# Patient Record
Sex: Male | Born: 1935 | Race: White | Hispanic: No | Smoking: Former smoker
Health system: Southern US, Community
[De-identification: ages and names within clinical notes are randomized; demographics above are authoritative.]

## PROBLEM LIST (undated history)

## (undated) DIAGNOSIS — E785 Hyperlipidemia, unspecified: Secondary | ICD-10-CM

## (undated) DIAGNOSIS — F29 Unspecified psychosis not due to a substance or known physiological condition: Secondary | ICD-10-CM

## (undated) DIAGNOSIS — I639 Cerebral infarction, unspecified: Secondary | ICD-10-CM

## (undated) DIAGNOSIS — C50919 Malignant neoplasm of unspecified site of unspecified female breast: Secondary | ICD-10-CM

## (undated) DIAGNOSIS — G40909 Epilepsy, unspecified, not intractable, without status epilepticus: Secondary | ICD-10-CM

## (undated) DIAGNOSIS — E039 Hypothyroidism, unspecified: Secondary | ICD-10-CM

## (undated) DIAGNOSIS — T7840XA Allergy, unspecified, initial encounter: Secondary | ICD-10-CM

## (undated) DIAGNOSIS — F32A Depression, unspecified: Secondary | ICD-10-CM

## (undated) DIAGNOSIS — I10 Essential (primary) hypertension: Secondary | ICD-10-CM

## (undated) DIAGNOSIS — I509 Heart failure, unspecified: Secondary | ICD-10-CM

## (undated) DIAGNOSIS — F329 Major depressive disorder, single episode, unspecified: Secondary | ICD-10-CM

## (undated) HISTORY — DX: Unspecified psychosis not due to a substance or known physiological condition: F29

## (undated) HISTORY — DX: Depression, unspecified: F32.A

## (undated) HISTORY — PX: APPENDECTOMY: SHX54

## (undated) HISTORY — DX: Hyperlipidemia, unspecified: E78.5

## (undated) HISTORY — DX: Essential (primary) hypertension: I10

## (undated) HISTORY — PX: EYE SURGERY: SHX253

## (undated) HISTORY — DX: Cerebral infarction, unspecified: I63.9

## (undated) HISTORY — DX: Heart failure, unspecified: I50.9

## (undated) HISTORY — DX: Allergy, unspecified, initial encounter: T78.40XA

## (undated) HISTORY — DX: Major depressive disorder, single episode, unspecified: F32.9

## (undated) HISTORY — DX: Malignant neoplasm of unspecified site of unspecified female breast: C50.919

## (undated) HISTORY — DX: Hypothyroidism, unspecified: E03.9

## (undated) HISTORY — DX: Epilepsy, unspecified, not intractable, without status epilepticus: G40.909

---

## 2002-02-06 ENCOUNTER — Encounter: Payer: Self-pay | Admitting: Ophthalmology

## 2002-02-10 ENCOUNTER — Ambulatory Visit (HOSPITAL_COMMUNITY): Admission: RE | Admit: 2002-02-10 | Discharge: 2002-02-10 | Payer: Self-pay | Admitting: Ophthalmology

## 2003-07-19 ENCOUNTER — Ambulatory Visit (HOSPITAL_COMMUNITY): Admission: RE | Admit: 2003-07-19 | Discharge: 2003-07-19 | Payer: Self-pay | Admitting: Family Medicine

## 2006-01-11 ENCOUNTER — Encounter: Payer: Self-pay | Admitting: Internal Medicine

## 2006-04-14 ENCOUNTER — Ambulatory Visit (HOSPITAL_COMMUNITY): Admission: RE | Admit: 2006-04-14 | Discharge: 2006-04-14 | Payer: Self-pay | Admitting: Family Medicine

## 2006-07-18 ENCOUNTER — Encounter: Admission: RE | Admit: 2006-07-18 | Discharge: 2006-07-18 | Payer: Self-pay | Admitting: Neurology

## 2006-09-23 ENCOUNTER — Ambulatory Visit (HOSPITAL_COMMUNITY): Admission: RE | Admit: 2006-09-23 | Discharge: 2006-09-23 | Payer: Self-pay | Admitting: Neurology

## 2009-05-13 ENCOUNTER — Inpatient Hospital Stay (HOSPITAL_COMMUNITY): Admission: EM | Admit: 2009-05-13 | Discharge: 2009-05-16 | Payer: Self-pay | Admitting: Emergency Medicine

## 2009-05-14 ENCOUNTER — Encounter (INDEPENDENT_AMBULATORY_CARE_PROVIDER_SITE_OTHER): Payer: Self-pay | Admitting: Internal Medicine

## 2009-06-06 ENCOUNTER — Emergency Department (HOSPITAL_COMMUNITY): Admission: EM | Admit: 2009-06-06 | Discharge: 2009-06-07 | Payer: Self-pay | Admitting: Emergency Medicine

## 2009-06-21 ENCOUNTER — Inpatient Hospital Stay (HOSPITAL_COMMUNITY): Admission: EM | Admit: 2009-06-21 | Discharge: 2009-06-23 | Payer: Self-pay | Admitting: Emergency Medicine

## 2009-09-30 ENCOUNTER — Encounter (INDEPENDENT_AMBULATORY_CARE_PROVIDER_SITE_OTHER): Payer: Self-pay | Admitting: Internal Medicine

## 2009-09-30 ENCOUNTER — Inpatient Hospital Stay (HOSPITAL_COMMUNITY): Admission: RE | Admit: 2009-09-30 | Discharge: 2009-10-02 | Payer: Self-pay | Admitting: Emergency Medicine

## 2009-09-30 ENCOUNTER — Ambulatory Visit: Payer: Self-pay | Admitting: Surgery

## 2009-10-01 ENCOUNTER — Encounter (INDEPENDENT_AMBULATORY_CARE_PROVIDER_SITE_OTHER): Payer: Self-pay | Admitting: Internal Medicine

## 2010-03-07 ENCOUNTER — Encounter: Payer: Self-pay | Admitting: Internal Medicine

## 2010-03-10 ENCOUNTER — Inpatient Hospital Stay (HOSPITAL_COMMUNITY): Admission: EM | Admit: 2010-03-10 | Discharge: 2010-03-13 | Payer: Self-pay | Admitting: Emergency Medicine

## 2010-03-11 ENCOUNTER — Encounter (INDEPENDENT_AMBULATORY_CARE_PROVIDER_SITE_OTHER): Payer: Self-pay | Admitting: Internal Medicine

## 2010-03-11 ENCOUNTER — Ambulatory Visit: Payer: Self-pay | Admitting: Vascular Surgery

## 2010-06-09 ENCOUNTER — Telehealth: Payer: Self-pay | Admitting: Internal Medicine

## 2010-06-12 ENCOUNTER — Encounter: Payer: Self-pay | Admitting: Internal Medicine

## 2010-06-13 ENCOUNTER — Ambulatory Visit
Admission: RE | Admit: 2010-06-13 | Discharge: 2010-06-13 | Payer: Self-pay | Source: Home / Self Care | Attending: Internal Medicine | Admitting: Internal Medicine

## 2010-06-13 ENCOUNTER — Encounter: Payer: Self-pay | Admitting: Internal Medicine

## 2010-06-13 DIAGNOSIS — I10 Essential (primary) hypertension: Secondary | ICD-10-CM | POA: Insufficient documentation

## 2010-06-13 DIAGNOSIS — R609 Edema, unspecified: Secondary | ICD-10-CM | POA: Insufficient documentation

## 2010-06-13 DIAGNOSIS — F0150 Vascular dementia without behavioral disturbance: Secondary | ICD-10-CM | POA: Insufficient documentation

## 2010-06-13 DIAGNOSIS — E785 Hyperlipidemia, unspecified: Secondary | ICD-10-CM | POA: Insufficient documentation

## 2010-06-13 DIAGNOSIS — I6990 Unspecified sequelae of unspecified cerebrovascular disease: Secondary | ICD-10-CM | POA: Insufficient documentation

## 2010-06-13 DIAGNOSIS — F329 Major depressive disorder, single episode, unspecified: Secondary | ICD-10-CM | POA: Insufficient documentation

## 2010-06-13 DIAGNOSIS — I5022 Chronic systolic (congestive) heart failure: Secondary | ICD-10-CM | POA: Insufficient documentation

## 2010-06-13 DIAGNOSIS — J309 Allergic rhinitis, unspecified: Secondary | ICD-10-CM | POA: Insufficient documentation

## 2010-06-13 DIAGNOSIS — R443 Hallucinations, unspecified: Secondary | ICD-10-CM | POA: Insufficient documentation

## 2010-06-13 DIAGNOSIS — K5909 Other constipation: Secondary | ICD-10-CM | POA: Insufficient documentation

## 2010-06-13 DIAGNOSIS — R569 Unspecified convulsions: Secondary | ICD-10-CM | POA: Insufficient documentation

## 2010-06-13 DIAGNOSIS — E039 Hypothyroidism, unspecified: Secondary | ICD-10-CM | POA: Insufficient documentation

## 2010-06-16 ENCOUNTER — Encounter: Payer: Self-pay | Admitting: Internal Medicine

## 2010-06-17 LAB — CONVERTED CEMR LAB
ALT: 9 units/L (ref 0–53)
AST: 15 units/L (ref 0–37)
Basophils Absolute: 0 10*3/uL (ref 0.0–0.1)
CO2: 22 meq/L (ref 19–32)
Creatinine, Ser: 0.88 mg/dL (ref 0.40–1.50)
Eosinophils Absolute: 0.3 10*3/uL (ref 0.0–0.7)
Free T4: 1.21 ng/dL (ref 0.80–1.80)
Glucose, Bld: 108 mg/dL — ABNORMAL HIGH (ref 70–99)
HCT: 44.6 % (ref 39.0–52.0)
Hemoglobin: 14.9 g/dL (ref 13.0–17.0)
LDL Cholesterol: 141 mg/dL — ABNORMAL HIGH (ref 0–99)
Monocytes Relative: 8 % (ref 3–12)
Neutrophils Relative %: 69 % (ref 43–77)
Platelets: 306 10*3/uL (ref 150–400)
RBC: 4.43 M/uL (ref 4.22–5.81)
RDW: 13.4 % (ref 11.5–15.5)
Sodium: 143 meq/L (ref 135–145)
Total Bilirubin: 0.4 mg/dL (ref 0.3–1.2)
Total CHOL/HDL Ratio: 5.5
Total Protein: 7.2 g/dL (ref 6.0–8.3)
Triglycerides: 134 mg/dL (ref <150)
VLDL: 27 mg/dL (ref 0–40)
WBC: 9.2 10*3/uL (ref 4.0–10.5)

## 2010-06-23 ENCOUNTER — Encounter: Payer: Self-pay | Admitting: Internal Medicine

## 2010-06-24 ENCOUNTER — Telehealth: Payer: Self-pay | Admitting: Internal Medicine

## 2010-06-26 NOTE — Miscellaneous (Signed)
Summary: Do Not Resuscitate Order  Do Not Resuscitate Order   Imported By: Beau Fanny 06/17/2010 10:47:36  _____________________________________________________________________  External Attachment:    Type:   Image     Comment:   External Document

## 2010-06-26 NOTE — Assessment & Plan Note (Signed)
Summary: NEW PATIENT/DS   Vital Signs:  Patient profile:   77 year old male Height:      71 inches Weight:      168 pounds BMI:     23.52 O2 Sat:      91 % on Room air Temp:     97.7 degrees F oral Pulse rate:   69 / minute Pulse rhythm:   regular BP sitting:   117 / 63  (left arm) Cuff size:   large  Vitals Entered By: Mervin Hack CMA Duncan Dull) (June 13, 2010 3:35 PM)  O2 Flow:  Room air CC: new patient to establish care   History of Present Illness: Here with daughter, [name] and friend, [name] (caregiver)  Establishing here had been under the care of [facility]--Dr Jeannetta Nap Not convenient location  Had just had stay in [facility] for rehab after apparent minor stroke Was in [facility] the SNF There May until last week did have pneumonia hospitalization in October Lives in his home--now with modifications for handicapped Daughter lives there Had 24 hour care  Hasn't walked since April Now can walk with walker briefly--wheelchair in house at times and for going out Needs assist with bathing and dressing Occ urinary and stool  incontinence. Usually continent though Some swalling problems in past---briefly on honey and nectar thick liquids but back on thin liquids Now on normal consistency diet (avoiding steak, etc)  Has had some behavior issues of late Didn't sleep well last night---more sedate now Other days, "a holy terror".  Tries to go outside, gently physical aggression (just pushes people away), not cooperative ???depression Has been treated since wife died 8 years ago (10mg  then) Some delusions/hallucinations--reaching for objects in the air, talks with wife Occ anxiety--fear of falling or being alone  Hypothyroidism diagnosed in past few months  Long standing HTN and high chol for some time Off BP meds for some time and restarted in hospital recently  Seizure disorder since 5-7 years none lately  off valproate now of late   Preventive  Screening-Counseling & Management  Alcohol-Tobacco     Smoking Status: quit  Allergies (verified): 1)  ! Diamox Sequels (Acetazolamide) 2)  ! Ambien (Zolpidem Tartrate) 3)  ! Cephalexin (Cephalexin)  Past History:  Past Medical History: Allergic rhinitis Depression Hyperlipidemia Hypertension Hypothyroidism Vascular dementia CVA--late CHF--systolic  (apparent silent MI per echo) Seizure disorder Breast cancer--right   (mastectomy and no other Rx) Psychosis Constipation --chronic  Past Surgical History: Cataract extraction-- then further work due to complications Appendectomy Tonsillectomy Mastectomy on right--  ~1986  Family History: Strokes are strong in family--all brothers died of this Parents died of stroke and MI SOme Altzheimers in sisters  Social History: Widowed  ~2003 1 daughter Retired---sheet Advice worker Former Smoker Alcohol use-no  Has living will. Daughter has health care power of attorney. Has DNR order. No tube feedsSmoking Status:  quit  Review of Systems General:  eating pretty well weight may be down a touch sleeps okay usually. Eyes:  Denies double vision; can't see out of right. ENT:  reasonable hearing own teeth. CV:  Denies chest pain or discomfort, fatigue, palpitations, and shortness of breath with exertion. Resp:  Complains of cough; denies shortness of breath; occ cough--if he lies flat. GI:  Complains of constipation and hemorrhoids; occ red blood on toilet paper or dripping in bowl. GU:  Complains of incontinence, nocturia, and urinary hesitancy; uses urinal at night. MS:  Complains of joint pain and joint  swelling; ongoing knee, hip, pelvis, feet pain did have crane injury years ago to pelvis and foot. Derm:  current skin cancer---Dr Margo Aye will remove SCC on right cheek Multiple skin cancer. Neuro:  Complains of headaches and weakness; occ headaches. Psych:  Complains of alternate hallucination ( auditory/visual), anxiety,  and depression. Allergy:  Complains of seasonal allergies and sneezing; No meds currently.  Physical Exam  General:  alert and pleasant Limited interaction Eyes:  irregular right pupil left is normal Mouth:  no erythema, no exudates, and no lesions.   Neck:  supple, no masses, no thyromegaly, and no cervical lymphadenopathy.   Lungs:  normal respiratory effort, no intercostal retractions, no accessory muscle use, and normal breath sounds.   Heart:  normal rate, regular rhythm, no murmur, and no gallop.   Abdomen:  soft and non-tender.   Msk:  thickening in joints but active synovitis Extremities:  no edema feet cool with mild purplish discoloration mycotic toenails Neurologic:  Oriented only to person and knows daughter/friend Appropriate terse answers Mild spasticity and slight decreased strength on right side Face has slight droop to right Skin:  no ulcerations.   Large SCC on right face Axillary Nodes:  No palpable lymphadenopathy Psych:  subdued.   Limited interaction --fairly passive   Impression & Recommendations:  Problem # 1:  VASCULAR DEMENTIA (ICD-290.40) Assessment Comment Only mild to moderate Aricept did help so will continue  Problem # 2:  UNSPECIFIED PSYCHOSIS (ICD-298.9) Assessment: Deteriorated more noticeable not severe now Consider cutting back aricept May need antipsychotic if worsens  Problem # 3:  CHRONIC SYSTOLIC HEART FAILURE (ICD-428.22) Assessment: Unchanged seems to be compensated No changes for now check labs  His updated medication list for this problem includes:    Furosemide 20 Mg Tabs (Furosemide) .[name]... Take 1 by mouth once daily    Metoprolol Tartrate 25 Mg Tabs (Metoprolol tartrate) .[name]... Take 1 by mouth two times a day    Aggrenox 25-200 Mg Xr12h-cap (Aspirin-dipyridamole) .[name]... Take 1 by mouth two times a day  Problem # 4:  DEPRESSION (ICD-311) Assessment: Deteriorated has gotten more noticeable may be related to the  agitation/psychosis will increase back to 10mg  daily  His updated medication list for this problem includes:    Lexapro 10 Mg Tabs (Escitalopram oxalate) .[name]... 1 tab by mouth daily for depression  Problem # 5:  CONSTIPATION, CHRONIC (ICD-564.09) Assessment: Deteriorated will have them start the miralax daily  His updated medication list for this problem includes:    Miralax Powd (Polyethylene glycol 3350) .[name]... 1 capful with water daily to prevent constipation  Problem # 6:  HYPOTHYROIDISM (ICD-244.9) Assessment: Unchanged  will check labs  His updated medication list for this problem includes:    Levothyroxine Sodium 100 Mcg Tabs (Levothyroxine sodium) .[name]... Take 1 by mouth once daily  Orders: T-TSH ([id number]) T-T4, Free [phone]) Venipuncture ([address]) Specimen Handling (66440)  Problem # 7:  CEREBROVASCULAR ACCIDENT, LATE EFFECTS (ICD-438.9) Assessment: Unchanged uses walker/wheelchair has 24 hour care---needs some help with all ADLs  His updated medication list for this problem includes:    Aggrenox 25-200 Mg Xr12h-cap (Aspirin-dipyridamole) .[name]... Take 1 by mouth two times a day  Problem # 8:  SEIZURE DISORDER (ICD-780.39) Assessment: Unchanged off meds no recent seizures  Complete Medication List: 1)  Levothyroxine Sodium 100 Mcg Tabs (Levothyroxine sodium) .... Take 1 by mouth once daily 2)  Furosemide 20 Mg Tabs (Furosemide) .... Take 1 by mouth once daily 3)  Metoprolol Tartrate 25 Mg Tabs (  Metoprolol tartrate) .... Take 1 by mouth two times a day 4)  Aggrenox 25-200 Mg Xr12h-cap (Aspirin-dipyridamole) .... Take 1 by mouth two times a day 5)  Potassium Chloride 20 Meq Pack (Potassium chloride) .... Take 1 by mouth once daily in the evening 6)  Simvastatin 20 Mg Tabs (Simvastatin) .... Take 1 by mouth once daily in the evening 7)  Aricept 10 Mg Tabs (Donepezil hcl) .... Take 1 by mouth once daily in the evening 8)  Betoptic-s 0.25 % Susp (Betaxolol hcl)  .... Two times a day 9)  Muro 128 2 % Soln (Sodium chloride (hypertonic)) .... Three times a day 10)  Mupirocin 2 % Oint (Mupirocin) .... Three times a day 11)  Miralax Powd (Polyethylene glycol 3350) .[name].. 1 capful with water daily to prevent constipation 12)  Lexapro 10 Mg Tabs (Escitalopram oxalate) .[name].. 1 tab by mouth daily for depression  Other Orders: T-Lipid Profile [phone]) T-Hepatic Function [id number]) T-Renal Function Panel [id number]) T-CBC w/Diff [phone])  Patient Instructions: 1)  Please schedule a follow-up appointment in 1 month.  Prescriptions: ARICEPT 10 MG TABS (DONEPEZIL HCL) take 1 by mouth once daily in the evening  #30 x 12   Entered and Authorized by:   Cindee Salt MD   Signed by:   Cindee Salt MD on 06/13/2010   Method used:   Electronically to        [facility]* (retail)       [address]       [place], [place]  [address]       Ph: [phone]       Fax: [phone]   RxID:   [id number] SIMVASTATIN 20 MG TABS (SIMVASTATIN) take 1 by mouth once daily in the evening  #30 x 12   Entered and Authorized by:   Cindee Salt MD   Signed by:   Cindee Salt MD on 06/13/2010   Method used:   Electronically to        [facility]* (retail)       [address]       [place], [place]  [address]       Ph: [phone]       Fax: [phone]   RxID:   [id number] POTASSIUM CHLORIDE 20 MEQ PACK (POTASSIUM CHLORIDE) take 1 by mouth once daily in the evening  #30 x 12   Entered and Authorized by:   Cindee Salt MD   Signed by:   Cindee Salt MD on 06/13/2010   Method used:   Electronically to        [facility]* (retail)       [address]       [place], [place]  [address]       Ph: [phone]       Fax: [phone]   RxID:   [id number] AGGRENOX 25-200 MG XR12H-CAP (ASPIRIN-DIPYRIDAMOLE) take 1 by mouth two times a day  #60 x 12   Entered and Authorized by:   Cindee Salt MD    Signed by:   Cindee Salt MD on 06/13/2010   Method used:   Electronically to        [facility]* (retail)       [address]       [place], [place]  [address]       Ph: [phone]       Fax: [phone]   RxID:   [id number] METOPROLOL TARTRATE 25 MG TABS (METOPROLOL TARTRATE) take  1 by mouth two times a day  #60 x 12   Entered and Authorized by:   Cindee Salt MD   Signed by:   Cindee Salt MD on 06/13/2010   Method used:   Electronically to        [facility]* (retail)       [address]       [place], [place]  [address]       Ph: [phone]       Fax: [phone]   RxID:   [id number] FUROSEMIDE 20 MG TABS (FUROSEMIDE) take 1 by mouth once daily  #30 x 12   Entered and Authorized by:   Cindee Salt MD   Signed by:   Cindee Salt MD on 06/13/2010   Method used:   Electronically to        [facility]* (retail)       [address]       [place], [place]  [address]       Ph: [phone]       Fax: [phone]   RxID:   [id number] LEVOTHYROXINE SODIUM 100 MCG TABS (LEVOTHYROXINE SODIUM) take 1 by mouth once daily  #30 x 12   Entered and Authorized by:   Cindee Salt MD   Signed by:   Cindee Salt MD on 06/13/2010   Method used:   Electronically to        [facility]* (retail)       [address]       [place], [place]  [address]       Ph: [phone]       Fax: [phone]   RxID:   [id number] LEXAPRO 10 MG TABS (ESCITALOPRAM OXALATE) 1 tab by mouth daily for depression  #30 x 11   Entered and Authorized by:   Cindee Salt MD   Signed by:   Cindee Salt MD on 06/13/2010   Method used:   Electronically to        [facility]* (retail)       [address]       [place], [place]  [address]       Ph: [phone]       Fax: [phone]   RxID:   [id number]    Orders Added: 1)  New Patient Level V [99205] 2)  T-Lipid Profile [[id number]] 3)  T-Hepatic Function  [51025-85277] 4)  T-Renal Function Panel [[id number]] 5)  T-CBC w/Diff [[id number]] 6)  T-TSH [[id number]] 7)  T-T4, Free [61950-93267] 8)  Venipuncture [12458] 9)  Specimen Handling [99000]    Current Allergies (reviewed today): ! DIAMOX SEQUELS (ACETAZOLAMIDE) ! AMBIEN (ZOLPIDEM TARTRATE) ! CEPHALEXIN (CEPHALEXIN)

## 2010-06-26 NOTE — Miscellaneous (Signed)
Summary: Certification, Treatment Plan, Standing Orders/[facility]  Certification, Treatment Plan, Standing Orders/[facility]   Imported By: Maryln Gottron 06/18/2010 13:03:10  _____________________________________________________________________  External Attachment:    Type:   Image     Comment:   External Document

## 2010-06-26 NOTE — Progress Notes (Signed)
Summary: List of Medications brought in by patient  List of Medications brought in by patient   Imported By: Maryln Gottron 06/18/2010 13:01:03  _____________________________________________________________________  External Attachment:    Type:   Image     Comment:   External Document

## 2010-06-26 NOTE — Progress Notes (Signed)
Summary: pt is agitated  Phone Note Call from Patient Call back at Home Phone [phone]   Caller: Daughter  Micheal Townsend [phone] Summary of Call: Pt has a new pt appt on 1/27.  His daughter states pt is becoming increasingly more agitated. He is not currently on any meds for agitation.  He had ativan in the past but didnt tolerate that very well, it made him very hyper.  He does take a low dose of lexapro daily.  [facility]         Initial call taken by: Lowella Petties CMA, AAMA,  June 09, 2010 4:14 PM  Follow-up for Phone Call        Not sure what to use and not comfortable giving anything before his appt  If she is very worried about this, can move up appt to this Friday--add 45 minutes onto the end of my schedule need to make sure we get some records by then though Cindee Salt MD  June 09, 2010 5:03 PM   Spoke with daughter, changed appt to Friday 06/13/2010, records in your inbox DeShannon Katrinka Blazing CMA Duncan Dull)  June 09, 2010 5:16 PM

## 2010-06-26 NOTE — Miscellaneous (Signed)
Summary: [facility] Power of [facility] Power of Attorney   Imported By: Beau Fanny 06/17/2010 10:48:54  _____________________________________________________________________  External Attachment:    Type:   Image     Comment:   External Document

## 2010-07-01 ENCOUNTER — Encounter: Payer: Self-pay | Admitting: Internal Medicine

## 2010-07-02 NOTE — Letter (Signed)
Summary: Date Range: 11-26-09 to 03-07-10/[facility]  Date Range: 11-26-09 to 03-07-10/[facility]   Imported By: Sherian Rein 06/25/2010 07:57:28  _____________________________________________________________________  External Attachment:    Type:   Image     Comment:   External Document

## 2010-07-02 NOTE — Miscellaneous (Signed)
Summary: Orders/[facility] @ [facility]  Orders/[facility] @ [facility]   Imported By: Sherian Rein 06/25/2010 07:54:05  _____________________________________________________________________  External Attachment:    Type:   Image     Comment:   External Document

## 2010-07-02 NOTE — Progress Notes (Signed)
Summary: pt gets agitated  Phone Note From Other Clinic   Caller: Pam with [facility], [phone] Summary of Call: Hospice nurse called to report that pt becomes agitated and combative before sleep and his daughter is asking if he can be given something for this.  Nurse says his lexapro was recently increased.  These spells seem to come after he has had a couple of good days.  She also wanted you to be aware that pt has a lot of apnea.  She reports that his BP was 100/68 today, pulse 62, respirations 12.  Please advise nurse if another medicine can be added. Initial call taken by: Lowella Petties CMA, AAMA,  June 24, 2010 2:39 PM  Follow-up for Phone Call        Let her know I would like to avoid new meds  If the evening agitation is persistent, we will have to try something Nothing for now Follow-up by: Cindee Salt MD,  June 24, 2010 4:13 PM  Additional Follow-up for Phone Call Additional follow up Details #1::        Spoke with Pam with [facility],  and advised results.  Additional Follow-up by: Mervin Hack CMA (AAMA),  June 24, 2010 5:00 PM

## 2010-07-02 NOTE — Miscellaneous (Signed)
Summary: Care Plan Update/[facility] @ [facility] Update/[facility]ospice @ [facility] 2   Imported By: Sherian Rein 06/25/2010 07:55:37  _____________________________________________________________________  External Attachment:    Type:   Image     Comment:   External Document

## 2010-07-10 ENCOUNTER — Encounter: Payer: Self-pay | Admitting: Internal Medicine

## 2010-07-10 NOTE — Miscellaneous (Signed)
Summary: [facility] at [facility] Physician Orders   [facility]ospice at [facility] Physician Orders   Imported By: Kassie Mends 07/01/2010 08:46:04  _____________________________________________________________________  External Attachment:    Type:   Image     Comment:   External Document

## 2010-07-10 NOTE — Miscellaneous (Signed)
Summary: Hospice Plan of Care Update  Hospice Plan of Care Update   Imported By: Kassie Mends 06/30/2010 08:44:21  _____________________________________________________________________  External Attachment:    Type:   Image     Comment:   External Document

## 2010-07-17 ENCOUNTER — Encounter: Payer: Self-pay | Admitting: Internal Medicine

## 2010-07-17 ENCOUNTER — Ambulatory Visit: Payer: Medicare Other | Admitting: Internal Medicine

## 2010-07-18 ENCOUNTER — Encounter: Payer: Self-pay | Admitting: Internal Medicine

## 2010-07-22 NOTE — Miscellaneous (Signed)
Summary: Care Plan Update/[facility] Plan Update/[facility]   Imported By: Lanelle Bal 07/15/2010 13:04:18  _____________________________________________________________________  External Attachment:    Type:   Image     Comment:   External Document

## 2010-07-22 NOTE — Assessment & Plan Note (Signed)
Summary: 1 MTH FU/LFW   Vital Signs:  Patient profile:   77 year old male Weight:      163 pounds O2 Sat:      92 % on Room air Temp:     98.2 degrees F oral Pulse rate:   69 / minute Pulse rhythm:   regular BP sitting:   110 / 60  (left arm) Cuff size:   large  Vitals Entered By: Mervin Hack CMA Duncan Dull) (July 17, 2010 4:15 PM)  O2 Flow:  Room air CC: follow-up   History of Present Illness: Here with caregiver and daughter  Doing okay Has constant abnormal cycles--- up for 2 days and then sleeps for 2 days Not as agitated Does have hallucinations--may have some paranoia (bad dreams of someone coming for him)  Not eating great often needs to be fed No choking or swallowing problems  walks some with walker wheelchair about half the time  Allergies: 1)  ! Diamox Sequels (Acetazolamide) 2)  ! Ambien (Zolpidem Tartrate) 3)  ! Cephalexin (Cephalexin)  Past History:  Past medical, surgical, family and social histories (including risk factors) reviewed for relevance to current acute and chronic problems.  Past Medical History: Reviewed history from 06/13/2010 and no changes required. Allergic rhinitis Depression Hyperlipidemia Hypertension Hypothyroidism Vascular dementia CVA--late CHF--systolic  (apparent silent MI per echo) Seizure disorder Breast cancer--right   (mastectomy and no other Rx) Psychosis Constipation --chronic  Past Surgical History: Reviewed history from 06/13/2010 and no changes required. Cataract extraction-- then further work due to complications Appendectomy Tonsillectomy Mastectomy on right--  ~1986  Family History: Reviewed history from 06/13/2010 and no changes required. Strokes are strong in family--all brothers died of this Parents died of stroke and MI SOme Altzheimers in sisters  Social History: Reviewed history from 06/13/2010 and no changes required. Widowed  ~2003 1 daughter Retired---sheet Mudlogger Former Smoker Alcohol use-no  Has living will. Daughter has health care power of attorney. Has DNR order. No tube feeds  Review of Systems       has lost more weight still with right cheek lesion Bowels okay with miralax occ rectal bleeding with hard stool at times Occ ?headache--tylenol does help  Physical Exam  General:  eyes closed but responds to voice some talking Neck:  supple, no masses, and no cervical lymphadenopathy.   Lungs:  normal respiratory effort, no intercostal retractions, no accessory muscle use, and normal breath sounds.   Heart:  normal rate, regular rhythm, no murmur, and no gallop.   Abdomen:  soft and non-tender.   Extremities:  no edema Psych:  calm but did fire up a bit at the end "come on, let's go, I mean business"   Impression & Recommendations:  Problem # 1:  VASCULAR DEMENTIA (ICD-290.40) Assessment Unchanged fairly severe continues with 24 hour care on hospice  will cut aricept as it may affect appetite   Problem # 2:  CHRONIC SYSTOLIC HEART FAILURE (ICD-428.22) Assessment: Unchanged compensated no changes needed  His updated medication list for this problem includes:    Furosemide 20 Mg Tabs (Furosemide) .[name]... Take 1 by mouth once daily    Metoprolol Tartrate 25 Mg Tabs (Metoprolol tartrate) .[name]... Take 1 by mouth two times a day    Aggrenox 25-200 Mg Xr12h-cap (Aspirin-dipyridamole) .[name]... Take 1 by mouth two times a day  Problem # 3:  UNSPECIFIED PSYCHOSIS (ICD-298.9) Assessment: Unchanged still with delusions and hallucinations Not bad enough for meds at this point  Problem # 4:  DEPRESSION (ICD-311) Assessment: Improved mood better on increased lexapro though sleep wake cycle more abnormal continue for now  His updated medication list for this problem includes:    Lexapro 10 Mg Tabs (Escitalopram oxalate) .[name]... 1 tab by mouth daily for depression  Problem # 5:  HYPERTENSION (ICD-401.9) Assessment: Unchanged same  meds  His updated medication list for this problem includes:    Furosemide 20 Mg Tabs (Furosemide) .[name]... Take 1 by mouth once daily    Metoprolol Tartrate 25 Mg Tabs (Metoprolol tartrate) .[name]... Take 1 by mouth two times a day  BP today: 110/60 Prior BP: 117/63 (06/13/2010)  Labs Reviewed: K+: 4.0 (06/13/2010) Creat: : 0.88 (06/13/2010)   Chol: 205 (06/13/2010)   HDL: 37 (06/13/2010)   LDL: 141 (06/13/2010)   TG: 134 (06/13/2010)  Problem # 6:  HYPERLIPIDEMIA (ICD-272.4) Assessment: Comment Only discussed with daughter will stop this to reduce med burden  The following medications were removed from the medication list:    Simvastatin 20 Mg Tabs (Simvastatin) .[name]... Take 1 by mouth once daily in the evening  Complete Medication List: 1)  Levothyroxine Sodium 100 Mcg Tabs (Levothyroxine sodium) .... Take 1 by mouth once daily 2)  Furosemide 20 Mg Tabs (Furosemide) .... Take 1 by mouth once daily 3)  Metoprolol Tartrate 25 Mg Tabs (Metoprolol tartrate) .... Take 1 by mouth two times a day 4)  Aggrenox 25-200 Mg Xr12h-cap (Aspirin-dipyridamole) .... Take 1 by mouth two times a day 5)  Potassium Chloride 20 Meq Pack (Potassium chloride) .... Take 1 by mouth once daily in the evening 6)  Betoptic-s 0.25 % Susp (Betaxolol hcl) .... Two times a day 7)  Muro 128 2 % Soln (Sodium chloride (hypertonic)) .... Three times a day 8)  Mupirocin 2 % Oint (Mupirocin) .... Three times a day 9)  Miralax Powd (Polyethylene glycol 3350) .[name].. 1 capful with water daily to prevent constipation 10)  Lexapro 10 Mg Tabs (Escitalopram oxalate) .[name].. 1 tab by mouth daily for depression 11)  Donepezil Hcl 5 Mg Tabs (Donepezil hcl) .[name].. 1 tab daily for dementia  Patient Instructions: 1)  Please schedule a follow-up appointment in 3 months .  Prescriptions: DONEPEZIL HCL 5 MG TABS (DONEPEZIL HCL) 1 tab daily for dementia  #30 x 11   Entered and Authorized by:   Cindee Salt MD   Signed by:   Cindee Salt MD on 07/17/2010   Method used:   Electronically to        [facility]* (retail)       [address]       [place], [place]  [address]       Ph: [phone]       Fax: [phone]   RxID:   [id number]      Current Allergies (reviewed today): ! DIAMOX SEQUELS (ACETAZOLAMIDE) ! AMBIEN (ZOLPIDEM TARTRATE) ! CEPHALEXIN (CEPHALEXIN)

## 2010-07-31 ENCOUNTER — Encounter: Payer: Self-pay | Admitting: Internal Medicine

## 2010-07-31 NOTE — Miscellaneous (Signed)
Summary: Care Plan Update/Hospice @ [facility] Update/Hospice @ [facility]   Imported By: Sherian Rein 07/21/2010 09:31:21  _____________________________________________________________________  External Attachment:    Type:   Image     Comment:   External Document

## 2010-07-31 NOTE — Miscellaneous (Signed)
Summary: Hospice Plan of Care   Hospice Plan of Care   Imported By: Kassie Mends 07/23/2010 08:34:57  _____________________________________________________________________  External Attachment:    Type:   Image     Comment:   External Document

## 2010-08-04 ENCOUNTER — Telehealth: Payer: Self-pay | Admitting: Internal Medicine

## 2010-08-06 LAB — URINALYSIS, ROUTINE W REFLEX MICROSCOPIC
Bilirubin Urine: NEGATIVE
Glucose, UA: NEGATIVE mg/dL
Protein, ur: NEGATIVE mg/dL
Specific Gravity, Urine: 1.01 (ref 1.005–1.030)
Urobilinogen, UA: 0.2 mg/dL (ref 0.0–1.0)

## 2010-08-06 LAB — BASIC METABOLIC PANEL
BUN: 15 mg/dL (ref 6–23)
BUN: 16 mg/dL (ref 6–23)
Calcium: 8.6 mg/dL (ref 8.4–10.5)
Chloride: 112 mEq/L (ref 96–112)
Creatinine, Ser: 0.74 mg/dL (ref 0.4–1.5)
GFR calc non Af Amer: >60 mL/min (ref >60)
GFR calc non Af Amer: >60 mL/min (ref >60)
Glucose, Bld: 114 mg/dL — ABNORMAL HIGH (ref 70–99)
Glucose, Bld: 124 mg/dL — ABNORMAL HIGH (ref 70–99)

## 2010-08-06 LAB — CULTURE, BLOOD (ROUTINE X 2)

## 2010-08-06 LAB — CK TOTAL AND CKMB (NOT AT ARMC)
CK, MB: 1.4 ng/mL (ref 0.3–4.0)
Relative Index: INVALID (ref 0.0–2.5)

## 2010-08-06 LAB — VITAMIN B12: Vitamin B-12: 570 pg/mL (ref 211–911)

## 2010-08-06 LAB — COMPREHENSIVE METABOLIC PANEL
BUN: 21 mg/dL (ref 6–23)
CO2: 27 mEq/L (ref 19–32)
Chloride: 110 mEq/L (ref 96–112)
GFR calc non Af Amer: >60 mL/min (ref >60)
Glucose, Bld: 96 mg/dL (ref 70–99)

## 2010-08-06 LAB — CBC
Hemoglobin: 14.2 g/dL (ref 13.0–17.0)
MCH: 33.7 pg (ref 26.0–34.0)
MCH: 34.1 pg — ABNORMAL HIGH (ref 26.0–34.0)
MCHC: 33.1 g/dL (ref 30.0–36.0)
MCHC: 33.5 g/dL (ref 30.0–36.0)
MCHC: 33.7 g/dL (ref 30.0–36.0)
MCV: 102.9 fL — ABNORMAL HIGH (ref 78.0–100.0)
MCV: 99.7 fL (ref 78.0–100.0)
Platelets: 170 10*3/uL (ref 150–400)
RBC: 4.17 MIL/uL — ABNORMAL LOW (ref 4.22–5.81)
RDW: 13.5 % (ref 11.5–15.5)
RDW: 13.5 % (ref 11.5–15.5)
RDW: 13.6 % (ref 11.5–15.5)
WBC: 6.8 10*3/uL (ref 4.0–10.5)
WBC: 9.4 10*3/uL (ref 4.0–10.5)

## 2010-08-06 LAB — IRON AND TIBC
Saturation Ratios: 16 % — ABNORMAL LOW (ref 20–55)
TIBC: 244 ug/dL (ref 215–435)
UIBC: 206 ug/dL

## 2010-08-06 LAB — T4, FREE: Free T4: 0.85 ng/dL (ref 0.80–1.80)

## 2010-08-06 LAB — DIFFERENTIAL
Eosinophils Absolute: 0 10*3/uL (ref 0.0–0.7)
Eosinophils Relative: 0 % (ref 0–5)
Lymphs Abs: 1.5 10*3/uL (ref 0.7–4.0)
Monocytes Absolute: 1 10*3/uL (ref 0.1–1.0)
Monocytes Relative: 15 % — ABNORMAL HIGH (ref 3–12)
Neutro Abs: 4.2 10*3/uL (ref 1.7–7.7)
Neutrophils Relative %: 62 % (ref 43–77)

## 2010-08-06 LAB — URINE MICROSCOPIC-ADD ON

## 2010-08-06 LAB — T3, FREE: T3, Free: 1.6 pg/mL — ABNORMAL LOW (ref 2.3–4.2)

## 2010-08-06 LAB — TSH: TSH: 5.425 u[IU]/mL — ABNORMAL HIGH (ref 0.350–4.500)

## 2010-08-06 LAB — TROPONIN I: Troponin I: 0.02 ng/mL (ref 0.00–0.06)

## 2010-08-06 LAB — RETICULOCYTES: RBC.: 4.24 MIL/uL (ref 4.22–5.81)

## 2010-08-06 LAB — AMMONIA: Ammonia: 36 umol/L — ABNORMAL HIGH (ref 11–35)

## 2010-08-06 LAB — FERRITIN: Ferritin: 235 ng/mL (ref 22–322)

## 2010-08-10 LAB — URINALYSIS, ROUTINE W REFLEX MICROSCOPIC
Bilirubin Urine: NEGATIVE
Nitrite: NEGATIVE
Specific Gravity, Urine: 1.019 (ref 1.005–1.030)
Urobilinogen, UA: 0.2 mg/dL (ref 0.0–1.0)
pH: 5 (ref 5.0–8.0)

## 2010-08-10 LAB — CARDIAC PANEL(CRET KIN+CKTOT+MB+TROPI)
CK, MB: 1.6 ng/mL (ref 0.3–4.0)
Relative Index: INVALID (ref 0.0–2.5)
Relative Index: INVALID (ref 0.0–2.5)
Total CK: 30 U/L (ref 7–232)
Troponin I: 0.02 ng/mL (ref 0.00–0.06)
Troponin I: 0.06 ng/mL (ref 0.00–0.06)

## 2010-08-10 LAB — BASIC METABOLIC PANEL
BUN: 15 mg/dL (ref 6–23)
Calcium: 8.8 mg/dL (ref 8.4–10.5)
GFR calc non Af Amer: >60 mL/min (ref >60)
Glucose, Bld: 120 mg/dL — ABNORMAL HIGH (ref 70–99)
Potassium: 3.8 mEq/L (ref 3.5–5.1)

## 2010-08-10 LAB — DIFFERENTIAL
Basophils Absolute: 0 10*3/uL (ref 0.0–0.1)
Basophils Relative: 0 % (ref 0–1)
Eosinophils Absolute: 0.1 10*3/uL (ref 0.0–0.7)
Eosinophils Relative: 1 % (ref 0–5)
Lymphocytes Relative: 14 % (ref 12–46)
Lymphs Abs: 1.1 10*3/uL (ref 0.7–4.0)
Lymphs Abs: 1.1 10*3/uL (ref 0.7–4.0)
Monocytes Relative: 7 % (ref 3–12)
Neutro Abs: 5.7 10*3/uL (ref 1.7–7.7)
Neutrophils Relative %: 77 % (ref 43–77)
Neutrophils Relative %: 78 % — ABNORMAL HIGH (ref 43–77)

## 2010-08-10 LAB — GLUCOSE, CAPILLARY
Glucose-Capillary: 123 mg/dL — ABNORMAL HIGH (ref 70–99)
Glucose-Capillary: 165 mg/dL — ABNORMAL HIGH (ref 70–99)
Glucose-Capillary: 80 mg/dL (ref 70–99)

## 2010-08-10 LAB — URINE CULTURE: Colony Count: NO GROWTH

## 2010-08-10 LAB — CBC
HCT: 41.9 % (ref 39.0–52.0)
HCT: 42.8 % (ref 39.0–52.0)
MCHC: 33.2 g/dL (ref 30.0–36.0)
MCV: 100.9 fL — ABNORMAL HIGH (ref 78.0–100.0)
Platelets: 289 10*3/uL (ref 150–400)
Platelets: 195 10*3/uL (ref 150–400)
RDW: 14 % (ref 11.5–15.5)
WBC: 7.4 10*3/uL (ref 4.0–10.5)
WBC: 8.2 10*3/uL (ref 4.0–10.5)

## 2010-08-10 LAB — POCT I-STAT, CHEM 8
BUN: 16 mg/dL (ref 6–23)
Calcium, Ion: 1.17 mmol/L (ref 1.12–1.32)
Creatinine, Ser: 0.5 mg/dL (ref 0.4–1.5)
Hemoglobin: 15 g/dL (ref 13.0–17.0)
TCO2: 30 mmol/L (ref 0–100)

## 2010-08-10 LAB — VALPROIC ACID LEVEL
Valproic Acid Lvl: 20.1 ug/mL — ABNORMAL LOW (ref 50.0–100.0)
Valproic Acid Lvl: 28.7 ug/mL — ABNORMAL LOW (ref 50.0–100.0)
Valproic Acid Lvl: 54.5 ug/mL (ref 50.0–100.0)

## 2010-08-12 LAB — GLUCOSE, CAPILLARY
Glucose-Capillary: 106 mg/dL — ABNORMAL HIGH (ref 70–99)
Glucose-Capillary: 129 mg/dL — ABNORMAL HIGH (ref 70–99)
Glucose-Capillary: 129 mg/dL — ABNORMAL HIGH (ref 70–99)
Glucose-Capillary: 163 mg/dL — ABNORMAL HIGH (ref 70–99)
Glucose-Capillary: 98 mg/dL (ref 70–99)
Glucose-Capillary: 98 mg/dL (ref 70–99)

## 2010-08-12 LAB — URINE MICROSCOPIC-ADD ON

## 2010-08-12 LAB — COMPREHENSIVE METABOLIC PANEL
AST: 25 U/L (ref 0–37)
Albumin: 3.2 g/dL — ABNORMAL LOW (ref 3.5–5.2)
BUN: 19 mg/dL (ref 6–23)
Calcium: 9.2 mg/dL (ref 8.4–10.5)
Creatinine, Ser: 1.01 mg/dL (ref 0.4–1.5)
GFR calc Af Amer: >60 mL/min (ref >60)
GFR calc non Af Amer: >60 mL/min (ref >60)
Total Bilirubin: 0.5 mg/dL (ref 0.3–1.2)

## 2010-08-12 LAB — LIPID PANEL
Cholesterol: 278 mg/dL — ABNORMAL HIGH (ref 0–200)
HDL: 32 mg/dL — ABNORMAL LOW (ref >39)
Triglycerides: 147 mg/dL (ref <150)

## 2010-08-12 LAB — URINALYSIS, ROUTINE W REFLEX MICROSCOPIC
Bilirubin Urine: NEGATIVE
Glucose, UA: NEGATIVE mg/dL
Hgb urine dipstick: NEGATIVE
Ketones, ur: 15 mg/dL — AB
Protein, ur: 30 mg/dL — AB
pH: 5.5 (ref 5.0–8.0)

## 2010-08-12 LAB — CBC
HCT: 44.7 % (ref 39.0–52.0)
MCHC: 34.2 g/dL (ref 30.0–36.0)
Platelets: 200 10*3/uL (ref 150–400)
RDW: 13.9 % (ref 11.5–15.5)

## 2010-08-12 LAB — PROTIME-INR: Prothrombin Time: 14.2 seconds (ref 11.6–15.2)

## 2010-08-12 LAB — DIFFERENTIAL
Basophils Absolute: 0.1 10*3/uL (ref 0.0–0.1)
Basophils Relative: 1 % (ref 0–1)
Eosinophils Absolute: 0.1 10*3/uL (ref 0.0–0.7)
Eosinophils Relative: 2 % (ref 0–5)
Lymphocytes Relative: 18 % (ref 12–46)
Monocytes Absolute: 0.6 10*3/uL (ref 0.1–1.0)

## 2010-08-12 LAB — BASIC METABOLIC PANEL
BUN: 19 mg/dL (ref 6–23)
CO2: 26 mEq/L (ref 19–32)
Calcium: 8.9 mg/dL (ref 8.4–10.5)
GFR calc Af Amer: >60 mL/min (ref >60)
GFR calc non Af Amer: >60 mL/min (ref >60)
GFR calc non Af Amer: >60 mL/min (ref >60)
Glucose, Bld: 112 mg/dL — ABNORMAL HIGH (ref 70–99)
Glucose, Bld: 122 mg/dL — ABNORMAL HIGH (ref 70–99)
Potassium: 3.8 mEq/L (ref 3.5–5.1)
Potassium: 4 mEq/L (ref 3.5–5.1)
Sodium: 141 mEq/L (ref 135–145)

## 2010-08-12 LAB — CK TOTAL AND CKMB (NOT AT ARMC)
CK, MB: 2.3 ng/mL (ref 0.3–4.0)
Relative Index: INVALID (ref 0.0–2.5)

## 2010-08-12 LAB — CARDIAC PANEL(CRET KIN+CKTOT+MB+TROPI)
CK, MB: 1.7 ng/mL (ref 0.3–4.0)
CK, MB: 2.3 ng/mL (ref 0.3–4.0)
CK, MB: 2.9 ng/mL (ref 0.3–4.0)
Relative Index: INVALID (ref 0.0–2.5)
Relative Index: INVALID (ref 0.0–2.5)
Relative Index: INVALID (ref 0.0–2.5)
Total CK: 48 U/L (ref 7–232)
Total CK: 49 U/L (ref 7–232)
Total CK: 50 U/L (ref 7–232)
Total CK: 73 U/L (ref 7–232)
Troponin I: 0.03 ng/mL (ref 0.00–0.06)
Troponin I: 0.04 ng/mL (ref 0.00–0.06)

## 2010-08-12 LAB — HEMOGLOBIN A1C: Mean Plasma Glucose: 123 mg/dL — ABNORMAL HIGH (ref <117)

## 2010-08-12 LAB — TROPONIN I: Troponin I: 0.02 ng/mL (ref 0.00–0.06)

## 2010-08-12 LAB — HOMOCYSTEINE: Homocysteine: 14.5 umol/L (ref 4.0–15.4)

## 2010-08-12 NOTE — Progress Notes (Signed)
  Phone Note Other Incoming   Caller: Dr Lars Mage Summary of Call: calling to review his status I did feel he is still hospice appropriate and we discussed his still considerable care needs They will keep him on for now Initial call taken by: Cindee Salt MD,  August 04, 2010 10:51 AM

## 2010-08-12 NOTE — Miscellaneous (Signed)
Summary: Physician's orders   Physician's orders   Imported By: Kassie Mends 08/04/2010 10:29:23  _____________________________________________________________________  External Attachment:    Type:   Image     Comment:   External Document

## 2010-08-12 NOTE — Miscellaneous (Signed)
Summary: Hospice Care Plan  Hospice Care Plan   Imported By: Kassie Mends 08/06/2010 08:14:24  _____________________________________________________________________  External Attachment:    Type:   Image     Comment:   External Document

## 2010-08-13 DIAGNOSIS — F0390 Unspecified dementia without behavioral disturbance: Secondary | ICD-10-CM

## 2010-08-13 DIAGNOSIS — R569 Unspecified convulsions: Secondary | ICD-10-CM

## 2010-08-13 DIAGNOSIS — I10 Essential (primary) hypertension: Secondary | ICD-10-CM

## 2010-08-13 DIAGNOSIS — I6789 Other cerebrovascular disease: Secondary | ICD-10-CM

## 2010-08-25 LAB — BLOOD GAS, ARTERIAL
Acid-Base Excess: 6 mmol/L — ABNORMAL HIGH (ref 0.0–2.0)
Drawn by: 295031
O2 Content: 2 L/min
pCO2 arterial: 43.9 mmHg (ref 35.0–45.0)
pH, Arterial: 7.454 — ABNORMAL HIGH (ref 7.350–7.450)
pO2, Arterial: 71.1 mmHg — ABNORMAL LOW (ref 80.0–100.0)

## 2010-08-25 LAB — BASIC METABOLIC PANEL
BUN: 20 mg/dL (ref 6–23)
BUN: 24 mg/dL — ABNORMAL HIGH (ref 6–23)
CO2: 30 mEq/L (ref 19–32)
Chloride: 101 mEq/L (ref 96–112)
Chloride: 103 mEq/L (ref 96–112)
Glucose, Bld: 91 mg/dL (ref 70–99)
Glucose, Bld: 97 mg/dL (ref 70–99)
Potassium: 3.6 mEq/L (ref 3.5–5.1)
Potassium: 3.7 mEq/L (ref 3.5–5.1)
Sodium: 138 mEq/L (ref 135–145)
Sodium: 142 mEq/L (ref 135–145)

## 2010-08-25 LAB — DIFFERENTIAL
Basophils Relative: 1 % (ref 0–1)
Eosinophils Absolute: 0.1 10*3/uL (ref 0.0–0.7)
Lymphs Abs: 1.3 10*3/uL (ref 0.7–4.0)
Monocytes Relative: 12 % (ref 3–12)
Neutro Abs: 3.8 10*3/uL (ref 1.7–7.7)
Neutrophils Relative %: 64 % (ref 43–77)

## 2010-08-25 LAB — POCT CARDIAC MARKERS: Myoglobin, poc: 85.9 ng/mL (ref 12–200)

## 2010-08-25 LAB — VALPROIC ACID LEVEL: Valproic Acid Lvl: 37.9 ug/mL — ABNORMAL LOW (ref 50.0–100.0)

## 2010-08-25 LAB — POCT I-STAT, CHEM 8
Calcium, Ion: 1.1 mmol/L — ABNORMAL LOW (ref 1.12–1.32)
HCT: 47 % (ref 39.0–52.0)
Sodium: 141 mEq/L (ref 135–145)
TCO2: 30 mmol/L (ref 0–100)

## 2010-08-25 LAB — CBC
HCT: 40.7 % (ref 39.0–52.0)
Hemoglobin: 13.6 g/dL (ref 13.0–17.0)
MCHC: 33.5 g/dL (ref 30.0–36.0)
MCV: 97.6 fL (ref 78.0–100.0)
MCV: 98.4 fL (ref 78.0–100.0)
Platelets: 198 10*3/uL (ref 150–400)
RBC: 4.6 MIL/uL (ref 4.22–5.81)
RDW: 14.2 % (ref 11.5–15.5)
WBC: 5.9 10*3/uL (ref 4.0–10.5)

## 2010-08-25 LAB — LACTIC ACID, PLASMA: Lactic Acid, Venous: 1.3 mmol/L (ref 0.5–2.2)

## 2010-08-27 ENCOUNTER — Telehealth: Payer: Self-pay | Admitting: *Deleted

## 2010-08-27 NOTE — Telephone Encounter (Signed)
I spoke to daughter and arranged for initial home visit on Tuesday 4/10 around 2:30PM Message left for [facility] --hospice nurse

## 2010-08-27 NOTE — Telephone Encounter (Signed)
Pt is asking that you call her regarding scheduling a home visit with the patient.

## 2010-08-28 ENCOUNTER — Encounter: Payer: Self-pay | Admitting: Internal Medicine

## 2010-09-02 ENCOUNTER — Encounter: Payer: Self-pay | Admitting: Internal Medicine

## 2010-09-02 ENCOUNTER — Ambulatory Visit: Payer: Medicare Other | Admitting: Internal Medicine

## 2010-09-02 VITALS — BP 118/68 | HR 76 | Resp 18

## 2010-09-02 DIAGNOSIS — F0150 Vascular dementia without behavioral disturbance: Secondary | ICD-10-CM

## 2010-09-02 DIAGNOSIS — F329 Major depressive disorder, single episode, unspecified: Secondary | ICD-10-CM

## 2010-09-02 DIAGNOSIS — F29 Unspecified psychosis not due to a substance or known physiological condition: Secondary | ICD-10-CM

## 2010-09-02 DIAGNOSIS — I5022 Chronic systolic (congestive) heart failure: Secondary | ICD-10-CM

## 2010-09-02 NOTE — Progress Notes (Signed)
Subjective:    Patient ID: Micheal Townsend, male    DOB: 1917-05-01, 77 y.o.   MRN: [id number]  HPI Daughter and caregiver are here Micheal Townsend the hospice nurse is here also  Continues to have trouble with sleep Lorazepam didn't help much--may have been more wired the first day Will be up for 2 days straight still---may just yell out "hey" and want attention Will ask for things to be done---like something has to be paid, etc Visual hallucinations are more striking now Does have some paranoia at times--then he really needs proper attention  Appetite has decreased--needs to be "forced" at times Will say "that's enough" Does like ensure  Doesn't seem depressed Does get fired up and agitated at times  No obvious chest pain Breathing seems to be okay---but occ when "crashing" from being up for 2 days. His breathing will become irregular and more shallow SOme periods of apnea then as well  Walks with the walker at times--may be unstable though Mostly in wheelchair Has bathroom schedule after meals--no longer initiates this himself He gets a shower daily--has shower bench Needs to be dressed He will start at meals but needs to be fed towards the end  Past Medical History  Diagnosis Date  . Allergy   . Depression   . Stroke   . Hyperlipidemia   . Hypothyroidism        . Hypertension   . CHF (congestive heart failure)     systolic  . Vascular dementia   . Constipation   . Breast cancer     Right  . Psychosis   . Seizure disorder     Past Surgical History  Procedure Date  . Appendectomy   . Eye surgery     cataract surgery    Family History  Problem Relation Age of Onset  . Alzheimer's disease Sister   . Alzheimer's disease Sister     History   Social History  . Marital Status: Widowed    Spouse Name: N/A    Number of Children: 1  . Years of Education: N/A   Occupational History  . sheet metal worker     retired   Social History Main Topics  . Smoking  status: Former Games developer  . Smokeless tobacco: Not on file  . Alcohol Use: No  . Drug Use: Not on file  . Sexually Active: Not on file   Other Topics Concern  . Not on file   Social History Narrative   Lives with daughterShe has health care POAHas DNRNo tube feeds   Review of Systems Bowels have been okay with the miralax Seems to have lost weight  Voids okay     Objective:   Physical Exam  Constitutional: He appears well-developed and well-nourished. No distress.       Somnolent Does open his eyes and smile but only engages minimally  Neck: Normal range of motion. Neck supple. No JVD present.  Cardiovascular: Normal rate, regular rhythm and normal heart sounds.  Exam reveals no gallop.   No murmur heard. Pulmonary/Chest: Effort normal and breath sounds normal. He has no wheezes. He has no rales.  Abdominal: Soft. There is no tenderness.  Musculoskeletal: He exhibits no edema.  Lymphadenopathy:    He has no cervical adenopathy.  Neurological: He exhibits abnormal muscle tone.       Mildly increased tone No tremors  Psychiatric:       Mood is neutral Affect appropriate  Assessment & Plan:

## 2010-09-11 ENCOUNTER — Telehealth: Payer: Self-pay | Admitting: Internal Medicine

## 2010-09-11 NOTE — Telephone Encounter (Signed)
[name] --caregiver --reports problems getting him to swallow some of his bigger pills Mainly aggrenox and potassium  Will stop aggrenox and change to ASA 325 Stop potassium and check met B in 2 weeks

## 2010-09-24 ENCOUNTER — Telehealth: Payer: Self-pay | Admitting: Internal Medicine

## 2010-09-24 NOTE — Telephone Encounter (Signed)
Call from hospice nurse [name] Had episode of syncope while in shower chair today Aide noticed he was clammy and diaphoretic,then went out Was kept in chair May have reclined him some eventually Not back to normal awake state for 30 minutes Then complained of chest pain---but was tender in spot and that was his report Seems fine now VSS--BP 110/78  HR 68 Usual state  Discussed seems like orthostasis Doubt cardiac event NTG not approp Needs to recline immediately if recurrent event

## 2010-10-10 NOTE — Procedures (Signed)
REFERRING PHYSICIAN:  Outpatient, Windle Guard, M.D.   This is a right-handed male described as alert, awake, but hard-of-hearing  by the technician.  The patient is not exposed to hyperventilation or photic  stimulation due to a cardiac history.  Medications:  Reminyl 24 mg, Allegra,  Lexapro, and hydrochlorothiazide.  Last meal was breakfast.  H&P not  available.   HISTORY:  An 77 year old male, oriented but hard-of-hearing accompanied by a  daughter who claims he had an episode of generalized shaking lasting for  about 2 minutes on July 05, 2003.   TECHNICAL DATA:  This is a 17-channel EEG recording with one electrode  representing heart rate and rhythm exclusively.  The study is performed  under the 10-20 placement system of electrodes.   A posterior dominant 8 Hz rhythm is seen over both posterior hemispheres and  emanates simultaneously and synchronously as well as this alleviates  promptly with eye-opening.  The patient soon progressed into drowsiness but  never reached EEG criteria of sleep.  There is intermittent right-sided  central slowing noticed but it does lack a counterpart in the left  hemisphere.  Therefore, I would rate this as a mildly-abnormal EEG with  right-sided mild slowing but no epileptiform discharges are noticed.  An  underlying structural abnormality in the right hemisphere should be ruled  out.    Melvyn Novas, M.D.   ZO:XWRU  D:  07/19/2003 12:41:11  T:  07/19/2003 13:17:37  Job #:  [id number]   cc:   Windle Guard, M.D.  [address]  [place], [place] [address]  Fax: [phone]

## 2010-10-10 NOTE — Procedures (Signed)
EEG NUMBER:  [id number].   CLINICAL HISTORY:  This is an 77 year old male with syncopal episodes along  with seizure activity.  Medication list was hydrochlorothiazide, Razadyne,  aspirin, Lexapro, Allegra, levothyroxine.   This is sleep-deprived EEG to performed with the patient mostly end awake  state during the recording.  The background awake rhythm consists of 7-8 Hz  alpha which is of diminished amplitude, reactive, synchronous with eye  opening and closure.  No paroxysmal epileptiform activity, spikes or sharp  waves are seen.  Mild drowsiness changes are seen.  No definitive sleep  changes are noted.  Length of this EEG is 25 minutes.  Technical component  is average.  EKG tracing reveals regular sinus rhythm.  Hyperventilation is  not performed.  Photic stimulation is unremarkable.  Technical component of  the study is suboptimal with excessive muscle artifacts throughout the  tracing which compromised the study.   IMPRESSION:  This EEG is suboptimal due to excessive muscle artifacts but is  probably within normal limits.  No definite epileptiform activity is seen.           ______________________________  Sunny Schlein. Pearlean Brownie, MD     HQI:ONGE  D:  04/14/2006 20:04:09  T:  04/15/2006 00:33:22  Job #:  [id number]   cc:   Windle Guard, M.D.  Fax: [phone]

## 2010-10-10 NOTE — Op Note (Signed)
NAME:  Micheal Townsend, Micheal Townsend                         ACCOUNT NO.:  [id number]   MEDICAL RECORD NO.:  [id number]                   PATIENT TYPE:  OIB   LOCATION:  2899                                 FACILITY:  [facility]   PHYSICIAN:  Guadelupe Sabin, M.D.             DATE OF BIRTH:  [birth year]   DATE OF PROCEDURE:  02/10/2002  DATE OF DISCHARGE:                                 OPERATIVE REPORT   PREOPERATIVE DIAGNOSES:  Senile nuclear cataract, right eye; exfoliation  syndrome of lens capsule, right eye; secondary glaucoma, right eye.   POSTOPERATIVE DIAGNOSES:  Senile nuclear cataract, right eye; exfoliation  syndrome of lens capsule, right eye; secondary glaucoma, right eye.   OPERATION:  Planned extracapsular cataract extraction --  phacoemulsification, peripheral iridectomy, anterior vitrectomy, placement  of anterior chamber intraocular lens implant.   SURGEON:  Guadelupe Sabin, M.D.   ASSISTANT:  Nurse.   ANESTHESIA:  Local 4% Xylocaine and 0.75% Marcaine retrobulbar block,  topical tetracaine, intraocular Xylocaine, anesthesia standby required in  this elderly patient.   DESCRIPTION OF PROCEDURE:  After the patient was prepped and draped, a lid  speculum was inserted in the right eye.  Schiotz tonometry was recorded at 7  scale units with a 5.5 g weight.  A peritomy was performed adjacent to the  limbus to the 11 to 1 o'clock position.  The corneoscleral junction was  cleaned and a corneoscleral groove made with a 45 degree Superblade.  The  anterior chamber was then entered with a 2.75-mm metal keratome at the 12  o'clock position and a 15 degree blade at the 2:30 p.m.  Using a bent 26-  gauge needle on a Healon syringe, a circular capsulorrhexis was begun and  completed with the Grabow forceps.  It was difficult to get a circular  capsulorrhexis on the nasal side and it appeared irregular.  A 30 degree  phacoemulsification tip was then inserted after hydrodissection and  hydrodelineation and slow controlled emulsification of the lens nucleus  achieved, total ultrasonic time -- 3 minutes 24 seconds.  The lens was  extremely firm, requiring fragmentation, and it was difficult to bring into  the pupillary aperture.  Toward the end of the emulsification, there was  suspicion that a possible break or zonular dialysis had occurred on the  nasal side.  There appeared to be some vitreous in the incision and the  anterior vitrectomy apparatus was begun and the vitreous removed from the  anterior chamber until there was no wick present.  The posterior capsule  actually appeared to be almost intact and it was elected to insert an  [employer] SI40NB silicone three-piece posterior chamber  intraocular lens.  This was inserted into the anterior chamber and then slid  into the ciliary sulcus.  It did not appear to be well-supported and began  to dislocate.  It was then brought back into the anterior chamber  and  removed.  The incision was extended after a peripheral iridectomy had been  performed and an anterior chamber implant inserted, diopter strength +17;  this was made of polymethyl methacrylate and appeared to be well-centered.  The Healon which had been placed in the anterior chamber was aspirated with  the irrigation-aspiration tip and Miochol instilled in the anterior chamber.  Pressure appeared to be slightly elevated in the eye; the patient was given  500 mg of Diamox intravenously.  Using the side port incision, the anterior  chamber fluid was tapped and the eye became normotensive.  It was then  elected to close.  Maxitrol ointment was instilled in the conjunctival cul-  de-sac and a light patch and protective shield applied.  Duration of  procedure:  One hour.  The patient tolerated the procedure well in general  and left the operating room for the recovery room in good condition.   The patient is to return to the office in four to six hours for  office  reevaluation.                                               Guadelupe Sabin, M.D.    HNJ/MEDQ  D:  02/10/2002  T:  02/13/2002  Job:  [id number]

## 2010-10-10 NOTE — H&P (Signed)
NAME:  Micheal Townsend, Micheal Townsend                         ACCOUNT NO.:  [id number]   MEDICAL RECORD NO.:  [id number]                   PATIENT TYPE:  OIB   LOCATION:  2899                                 FACILITY:  [facility]   PHYSICIAN:  Guadelupe Sabin, M.D.             DATE OF BIRTH:  11-10-1916   DATE OF ADMISSION:  02/10/2002  DATE OF DISCHARGE:                                HISTORY & PHYSICAL   REASON FOR ADMISSION:  This was a planned outpatient surgical admission of  this 77 year old white male admitted for cataract/implant surgery of the  right eye.   PRESENT ILLNESS:  This patient was first seen in my office on December 15, 2001.  The patient had a history of cataract formation in both eyes of greater than  two years.  He had previously seen Dr. Lyn Henri of [facility] for  evaluation.  The patient stated he was having blurred vision and difficulty  in driving, especially at night.  Examination revealed visual acuity of  20/100 -- right eye,  20/40 -- left eye, with his present glasses.  Intraocular pressure was elevated in the right eye at 32 mm.  It was felt  the patient had a dense nuclear cataract of the right eye with exfoliation  of the lens capsule, indicating probable zonular instability.  Detailed  fundus examination revealed a dim view in the right eye due to the dense  cataract.  The retina appeared attached, however, with a disk-cup ratio of  0.2 to 0.3.  It was my impression that the patient had a dense cataract,  right eye, with exfoliation syndrome, and secondary glaucoma.  The patient  was felt to warrant cataract surgery but the pressure in the eye was  elevated and the patient was placed on Xalatan ophthalmic solution; this  reduced the intraocular pressure by January 05, 2002 to 19 mm in the right  eye.  It was then elected to proceed with cataract/implant surgery.  The  patient and his family were told he had a complex cataract situation with  glaucoma and  possibly a loose lens which might dislocate or cause other  complications.  They signed an informed consent and arrangements were made  for his outpatient admission at this time.   PAST MEDICAL HISTORY:  The patient is in stable general health under the  care of his regular physician, Dr. Windle Guard.  Dr. Jeannetta Nap noted the  patient's early dementia or Alzheimer's disease, along with hypertension and  hyperlipemia.   MEDICATIONS:  The patient has been taking hydrochlorothiazide and atenolol.   ALLERGIES:  He has no known allergies.   COMMENT:  He is felt to be a mild risk for the proposed surgery.   REVIEW OF SYSTEMS:  No cardiorespiratory complaints.   PHYSICAL EXAMINATION:  GENERAL:  The patient is a pleasant 77 year old white  male in no acute distress.  HEENT:  Eyes:  Ocular exam as noted above.  CHEST:  Lungs clear to percussion and auscultation.  HEART:  Normal sinus rhythm.  No cardiomegaly.  No murmurs.  ABDOMEN:  Negative.   ADMISSION DIAGNOSES:  1. Senile cataract, right eye.  2.     Exfoliation of lens capsule, right eye.  3. Secondary glaucoma, right eye.   SURGICAL PLAN:  Cataract/implant surgery, right eye.                                                Guadelupe Sabin, M.D.    HNJ/MEDQ  D:  02/10/2002  T:  02/13/2002  Job:  [id number]   cc:   Windle Guard, M.D.

## 2010-10-13 ENCOUNTER — Ambulatory Visit: Payer: Medicare Other | Admitting: Internal Medicine

## 2010-10-14 ENCOUNTER — Ambulatory Visit: Payer: Medicare Other | Admitting: Internal Medicine

## 2010-10-29 ENCOUNTER — Telehealth: Payer: Self-pay | Admitting: *Deleted

## 2010-10-29 NOTE — Telephone Encounter (Signed)
Hospice nurse called to report that pt is stable but family notes that over the last 2 weeks pt has had petit mal seizures.  They are asking if he can have oxygen in his home to use as needed.  Please advise.

## 2010-10-30 NOTE — Telephone Encounter (Signed)
That would be fine 

## 2010-10-30 NOTE — Telephone Encounter (Signed)
Spoke with hospice nurse and advised results  

## 2010-11-06 ENCOUNTER — Telehealth: Payer: Self-pay | Admitting: *Deleted

## 2010-11-06 NOTE — Telephone Encounter (Signed)
Hospice nurse called to report that pt's BP has gone "up" to 128/90, which nurse says she doesn't consider really up, but pt's family is asking that pt go back to taking one half of the lopressor dose that he was on- he was taking 25 mg's twice a day.  Please advise.

## 2010-11-06 NOTE — Telephone Encounter (Signed)
I personally would not add lopressor based on that BP reading but I do not know his history like Dr. Alphonsus Sias.  I would recommend calling back tomorrow with BP readings prior to restarting.

## 2010-11-06 NOTE — Telephone Encounter (Signed)
Left message on personal cell phone voicemail for [name] advising as instructed.

## 2010-11-10 ENCOUNTER — Telehealth: Payer: Self-pay | Admitting: Internal Medicine

## 2010-11-10 NOTE — Telephone Encounter (Signed)
Got phone call from hospice nurse on 6/12 Systolic BP running in the 80's Orders given to stop the metoprolol He is getting oxygen prn as well

## 2010-12-03 ENCOUNTER — Ambulatory Visit: Payer: Medicare Other | Admitting: Internal Medicine

## 2010-12-03 VITALS — BP 128/88 | HR 88 | Resp 24

## 2010-12-03 DIAGNOSIS — K5909 Other constipation: Secondary | ICD-10-CM

## 2010-12-03 DIAGNOSIS — I10 Essential (primary) hypertension: Secondary | ICD-10-CM

## 2010-12-03 DIAGNOSIS — F329 Major depressive disorder, single episode, unspecified: Secondary | ICD-10-CM

## 2010-12-03 DIAGNOSIS — I5022 Chronic systolic (congestive) heart failure: Secondary | ICD-10-CM

## 2010-12-03 DIAGNOSIS — F0150 Vascular dementia without behavioral disturbance: Secondary | ICD-10-CM

## 2010-12-03 DIAGNOSIS — F3289 Other specified depressive episodes: Secondary | ICD-10-CM

## 2010-12-03 NOTE — Assessment & Plan Note (Signed)
No signs of exacerbation No edema or SOB Will continue the lasix daily unless oral intake really drops off

## 2010-12-03 NOTE — Assessment & Plan Note (Signed)
.   BP Readings from Last 3 Encounters:  12/03/10 128/88  09/02/10 118/68  07/17/10 110/60   BP fine without the med Will stay off

## 2010-12-03 NOTE — Assessment & Plan Note (Signed)
Mood seems to be fine on the lexapro Has had better sleep/wake cycle lately but today may be a problem if he doesn't nap soon

## 2010-12-03 NOTE — Progress Notes (Signed)
Subjective:    Patient ID: Micheal Townsend, male    DOB: [birth year], 77 y.o.   MRN: [id number]  HPI Home visit [name] --daughter, [name]--caregiver and [name] hospice nurse are here  Some concern about his thumbs Nail looks like it may be coming off Purplish discoloration also noted No apparent pain  Still has fluctuating conciousness Totally awake today, no naps This will change into agitation and aggressive behavior Then he sleeps for a couple of days Had had more of a normal day/night cycle for the past 2 weeks Hasn't needed ativan for a while  Appetite has been good in the past week---but spotty before then  [facility] with walker only to the bathroom and to facilitate transfers to wheelchair This gets him very weak though Mostly uses wheelchair Occ asks to go to bathroom but generally incontinent of bowel and bladder They do try a toileting program  Generally satisfied occ gets on a bent that he wants to go home and they can't convince him he is  No chest pain No SOB but gives out even walking a few steps Off lopressor due to hypotension Highest is 153/98 since then--usually 139/89 or lower  Off aggrenox and now just on aspirin  Current Outpatient Prescriptions on File Prior to Visit  Medication Sig Dispense Refill  . aspirin 325 MG tablet Take 325 mg by mouth daily.        . betaxolol (BETOPTIC-S) 0.25 % ophthalmic suspension Place 1 drop into the right eye 2 (two) times daily.       [name] donepezil (ARICEPT) 5 MG tablet Take 5 mg by mouth at bedtime.        [name] escitalopram (LEXAPRO) 10 MG tablet Take 10 mg by mouth daily.        . furosemide (LASIX) 20 MG tablet Take 20 mg by mouth daily.        [name] levothyroxine (SYNTHROID, LEVOTHROID) 100 MCG tablet Take 100 mcg by mouth daily.        [name] LORazepam (ATIVAN) 0.5 MG tablet Take 0.5-1 mg by mouth at bedtime as needed.        . polyethylene glycol (MIRALAX / GLYCOLAX) packet Take 17 g by mouth daily.        . sodium chloride (MURO  128) 2 % ophthalmic solution Place 1 drop into the right eye 3 (three) times daily.         Allergies  Allergen Reactions  . Acetazolamide     REACTION: diarrhea  . Cephalexin     REACTION: diarrhea  . Zolpidem Tartrate     Past Medical History  Diagnosis Date  . Allergy   . Depression   . Stroke   . Hyperlipidemia   . Hypothyroidism        . Hypertension   . CHF (congestive heart failure)     systolic  . Vascular dementia   . Constipation   . Breast cancer     Right  . Psychosis   . Seizure disorder     Past Surgical History  Procedure Date  . Appendectomy   . Eye surgery     cataract surgery    Family History  Problem Relation Age of Onset  . Alzheimer's disease Sister   . Alzheimer's disease Sister     History   Social History  . Marital Status: Widowed    Spouse Name: N/A    Number of Children: 1  . Years of Education: N/A   Occupational  History  . sheet metal worker     retired   Social History Main Topics  . Smoking status: Former Games developer  . Smokeless tobacco: Not on file  . Alcohol Use: No  . Drug Use: Not on file  . Sexually Active: Not on file   Other Topics Concern  . Not on file   Social History Narrative   Lives with daughterShe has health care POAHas DNRNo tube feeds   Review of Systems Bowels have been fine with 5 prunes daily and the miralax He does seem to have lost some weight Occ headache--tylenol seems to help His short term memory is "shorter"----will eat and then 5 minutes later forget he ate    Objective:   Physical Exam  Constitutional: He appears well-developed and well-nourished.  Neck: Normal range of motion. Neck supple. No thyromegaly present.  Cardiovascular: Normal rate, regular rhythm and normal heart sounds.  Exam reveals no gallop.   No murmur heard.      Faint distal pulses  Abdominal: Soft. There is no tenderness.  Musculoskeletal:       Dystrophic nail left thumb Some nonspecific purplish  discoloration around right thumb nailbed  Lymphadenopathy:    He has no cervical adenopathy.  Neurological: He is alert. He exhibits normal muscle tone.       Conversant Discussed his [facility] service and his hunting, etc Very slight resting tremor  Psychiatric: He has a normal mood and affect.          Assessment & Plan:

## 2010-12-03 NOTE — Assessment & Plan Note (Signed)
Fairly severe but stable Dependent for ADLs except he will eat (and occ needs some help on sleepy days) Gets shower daily but not always shaved (loses patience sometimes)

## 2010-12-03 NOTE — Assessment & Plan Note (Signed)
Okay with prunes and miralax

## 2010-12-04 DIAGNOSIS — R569 Unspecified convulsions: Secondary | ICD-10-CM

## 2010-12-04 DIAGNOSIS — I10 Essential (primary) hypertension: Secondary | ICD-10-CM

## 2010-12-04 DIAGNOSIS — F0390 Unspecified dementia without behavioral disturbance: Secondary | ICD-10-CM

## 2010-12-04 DIAGNOSIS — I6789 Other cerebrovascular disease: Secondary | ICD-10-CM

## 2010-12-10 ENCOUNTER — Telehealth: Payer: Self-pay | Admitting: *Deleted

## 2010-12-10 MED ORDER — ENDIT EX OINT: 1.0000 "application " | TOPICAL_OINTMENT | Freq: Three times a day (TID) | CUTANEOUS | Status: DC | PRN

## 2010-12-10 NOTE — Telephone Encounter (Signed)
Please let her know Rx sent

## 2010-12-10 NOTE — Telephone Encounter (Signed)
Spoke with hospice nurse and advised results  

## 2010-12-10 NOTE — Telephone Encounter (Signed)
[name] says that patient has a little bit of breakdown on his bottom. She is asking for a rx for endit. Uses [facility].

## 2010-12-12 ENCOUNTER — Telehealth: Payer: Self-pay | Admitting: Internal Medicine

## 2010-12-12 NOTE — Telephone Encounter (Signed)
Phone call from Dr [facility] Due to stability, they will need to discharge him for the time being Discussed continuation of any durable medical equipment, like electric bed, which he needs for maintenance of function i will continue my home visits as usual

## 2010-12-15 ENCOUNTER — Telehealth: Payer: Self-pay | Admitting: *Deleted

## 2010-12-15 NOTE — Telephone Encounter (Signed)
rx faxed, notes faxed also.

## 2010-12-15 NOTE — Telephone Encounter (Signed)
Rx written Please also fax over copies of my last home visit And phone note with Dr Delanna Notice last week

## 2010-12-15 NOTE — Telephone Encounter (Signed)
Pt is being discharged from Hospice services and family is asking that orders for hospital bed, walker, wheelchair and oxygen be faxed to [facility] at fax number [phone].  Pt will continue to need these.

## 2010-12-17 ENCOUNTER — Other Ambulatory Visit: Payer: Self-pay | Admitting: *Deleted

## 2010-12-17 MED ORDER — LORAZEPAM 0.5 MG PO TABS
0.5000 mg | ORAL_TABLET | Freq: Every evening | ORAL | Status: DC | PRN
Start: 2010-12-17 — End: 2011-10-09

## 2010-12-17 NOTE — Telephone Encounter (Signed)
Please call in

## 2010-12-17 NOTE — Telephone Encounter (Signed)
Micheal Townsend PATIENT, ok to refill? 

## 2010-12-18 NOTE — Telephone Encounter (Signed)
rx called into pharmacy

## 2011-01-05 ENCOUNTER — Encounter: Payer: Self-pay | Admitting: Internal Medicine

## 2011-03-11 ENCOUNTER — Other Ambulatory Visit: Payer: Self-pay | Admitting: *Deleted

## 2011-03-11 MED ORDER — POLYETHYLENE GLYCOL 3350 17 G PO PACK: 17.0000 g | PACK | Freq: Every day | ORAL | Status: DC

## 2011-03-18 ENCOUNTER — Encounter: Payer: Self-pay | Admitting: Internal Medicine

## 2011-03-18 ENCOUNTER — Ambulatory Visit: Payer: Medicare Other | Admitting: Internal Medicine

## 2011-03-18 VITALS — BP 134/68 | HR 78 | Resp 18

## 2011-03-18 DIAGNOSIS — F0150 Vascular dementia without behavioral disturbance: Secondary | ICD-10-CM

## 2011-03-18 DIAGNOSIS — F329 Major depressive disorder, single episode, unspecified: Secondary | ICD-10-CM

## 2011-03-18 DIAGNOSIS — I10 Essential (primary) hypertension: Secondary | ICD-10-CM

## 2011-03-18 DIAGNOSIS — I5022 Chronic systolic (congestive) heart failure: Secondary | ICD-10-CM

## 2011-03-18 DIAGNOSIS — F29 Unspecified psychosis not due to a substance or known physiological condition: Secondary | ICD-10-CM

## 2011-03-18 NOTE — Assessment & Plan Note (Signed)
Seems satisfied Will continue the lexapro

## 2011-03-18 NOTE — Assessment & Plan Note (Signed)
Will change lasix to prn No evidence of problems and weight probably down (as well as oral intake)

## 2011-03-18 NOTE — Assessment & Plan Note (Signed)
Severe Total care except occ starts eating himself (2-3 bites) then needs to be fed toileting program only occ successfully

## 2011-03-18 NOTE — Assessment & Plan Note (Signed)
Has improved Only rare paranoia Will get agitated occ after up for prolonged time---lorazepam has helped when bad

## 2011-03-18 NOTE — Progress Notes (Signed)
Subjective:    Patient ID: KYL GIVLER, male    DOB: 06-07-1916, 77 y.o.   MRN: [id number]  HPI Seen with [name]---daughter and caregiver  Still runs on his schedule---sleeps for a day, then up for 2 days More frequent now though When he is up for extended time, he will fall out "like he was dead" Rarely agitated now though---after up for awhile Has gotten benefit from lorazepam 1mg  a couple of times  Sporadically doesn't know his daughter Not walking now Will assist with transfer into wheelchair Is on bathroom schedule but sporadic success occ tells them he has to go Rarely will stand on his own---fortunately hasn't fallen. Use chair alarm  Eats okay on his alert days No way to weigh him--seems to be about the same Occ spits food out and other times will pocket it Can't tolerate ensure Will brush teeth with power toothbrush often Daily shower with chair  No apparent chest pain No SOB but does have some apnea spells at times---may go 15-20 seconds Uses the oxygen when sleeping  Seems satisfied Not anxious unless overtired  Current Outpatient Prescriptions on File Prior to Visit  Medication Sig Dispense Refill  . aspirin 325 MG tablet Take 162 mg by mouth 2 (two) times daily.       . betaxolol (BETOPTIC-S) 0.25 % ophthalmic suspension Place 1 drop into the right eye 2 (two) times daily.       [name] donepezil (ARICEPT) 5 MG tablet Take 5 mg by mouth at bedtime.        [name] escitalopram (LEXAPRO) 10 MG tablet Take 10 mg by mouth daily.        . furosemide (LASIX) 20 MG tablet Take 20 mg by mouth daily.        [name] levothyroxine (SYNTHROID, LEVOTHROID) 100 MCG tablet Take 100 mcg by mouth daily.        [name] LORazepam (ATIVAN) 0.5 MG tablet Take 1-2 tablets (0.5-1 mg total) by mouth at bedtime as needed.  60 tablet  1  . polyethylene glycol (MIRALAX / GLYCOLAX) packet Take 17 g by mouth daily.  14 each  11  . Skin Protectants, Misc. (ENDIT) OINT Apply 1 application topically 3 (three)  times daily as needed.  60 g  50  . sodium chloride (MURO 128) 2 % ophthalmic solution Place 1 drop into the right eye 3 (three) times daily.         Allergies  Allergen Reactions  . Acetazolamide     REACTION: diarrhea  . Cephalexin     REACTION: diarrhea  . Zolpidem Tartrate     Past Medical History  Diagnosis Date  . Allergy   . Depression   . Stroke   . Hyperlipidemia   . Hypothyroidism        . Hypertension   . CHF (congestive heart failure)     systolic  . Vascular dementia   . Constipation   . Breast cancer     Right  . Psychosis   . Seizure disorder     Past Surgical History  Procedure Date  . Appendectomy   . Eye surgery     cataract surgery    Family History  Problem Relation Age of Onset  . Alzheimer's disease Sister   . Alzheimer's disease Sister     History   Social History  . Marital Status: Widowed    Spouse Name: N/A    Number of Children: 1  . Years of Education:  N/A   Occupational History  . sheet metal worker     retired   Social History Main Topics  . Smoking status: Former Games developer  . Smokeless tobacco: Not on file  . Alcohol Use: No  . Drug Use: Not on file  . Sexually Active: Not on file   Other Topics Concern  . Not on file   Social History Narrative   Lives with daughterShe has health care POAHas DNRNo tube feeds   Review of Systems occ paranoia---like thinking someone is breaking in if someone new is around Still occ wants to go home still Frequently has rash on back due to lying on it One bad area on back---endit seems to help    Objective:   Physical Exam  Constitutional: He appears well-developed.       Somnolent but responds to voice Cooperative Does have one word answers  HENT:  Mouth/Throat: Oropharynx is clear and moist.  Neck: Neck supple.  Cardiovascular: Normal rate, regular rhythm and normal heart sounds.  Exam reveals no gallop.   No murmur heard. Pulmonary/Chest: Effort normal and breath sounds  normal. No respiratory distress. He has no wheezes. He has no rales.  Abdominal: Soft. There is no tenderness.  Musculoskeletal: He exhibits no edema and no tenderness.  Lymphadenopathy:    He has no cervical adenopathy.  Skin:       Mild irritative rash on upper back No skin breakdown          Assessment & Plan:

## 2011-03-18 NOTE — Assessment & Plan Note (Signed)
BP Readings from Last 3 Encounters:  03/18/11 134/68  12/03/10 128/88  09/02/10 118/68   Good control No meds needed Lasix daily

## 2011-04-28 DIAGNOSIS — Z0279 Encounter for issue of other medical certificate: Secondary | ICD-10-CM

## 2011-06-05 ENCOUNTER — Other Ambulatory Visit: Payer: Self-pay | Admitting: *Deleted

## 2011-06-05 MED ORDER — ESCITALOPRAM OXALATE 10 MG PO TABS: 10.0000 mg | ORAL_TABLET | Freq: Every day | ORAL | Status: DC

## 2011-06-05 NOTE — Telephone Encounter (Signed)
Rx sent electronically.  

## 2011-06-10 ENCOUNTER — Encounter: Payer: Self-pay | Admitting: Internal Medicine

## 2011-06-10 ENCOUNTER — Ambulatory Visit (INDEPENDENT_AMBULATORY_CARE_PROVIDER_SITE_OTHER): Payer: Medicare Other | Admitting: Internal Medicine

## 2011-06-10 DIAGNOSIS — F29 Unspecified psychosis not due to a substance or known physiological condition: Secondary | ICD-10-CM

## 2011-06-10 DIAGNOSIS — K5909 Other constipation: Secondary | ICD-10-CM

## 2011-06-10 DIAGNOSIS — E039 Hypothyroidism, unspecified: Secondary | ICD-10-CM

## 2011-06-10 DIAGNOSIS — F0150 Vascular dementia without behavioral disturbance: Secondary | ICD-10-CM

## 2011-06-10 DIAGNOSIS — F329 Major depressive disorder, single episode, unspecified: Secondary | ICD-10-CM

## 2011-06-10 DIAGNOSIS — I5022 Chronic systolic (congestive) heart failure: Secondary | ICD-10-CM

## 2011-06-10 MED ORDER — LEVOTHYROXINE SODIUM 100 MCG PO TABS: 100.0000 ug | ORAL_TABLET | Freq: Every day | ORAL | Status: DC

## 2011-06-10 NOTE — Assessment & Plan Note (Signed)
Still has hallucinations and delusions Not overly bothersome for him or caregivers---so no meds

## 2011-06-10 NOTE — Progress Notes (Signed)
Subjective:    Patient ID: Micheal Townsend, male    DOB: Aug 11, 1916, 78 y.o.   MRN: [id number]  HPI Seen with [name]--daughter and caregiver  Off the lasix since last visit No sig edema No apparent fluid accumulation No SOB  Has more lucid periods--talks about family, etc Still often doesn't know daughter May last 24 hours Next day he will be agitated, push them away, be upset. 24-36 hours Then he will sleep for 1-2 days Eats with his eyes closed then and doesn't quite wake up---very little on first day, some on the second day Appetite is bigger on lucid days to make up for off days  They uses the oxygen on his off days---may help him rest and be less fidgety or agitated Still with apnea of 15-20 seconds fairly regularly during sleep  Seems to see things--reaches into the air at times Gets upset when they don't see what he sees Sees brothers, has to go to work, etc No apparent depression  Will transfer somewhat---won't straighten up Trouble pivoting Did get up and walk on his own once since my last visit  He will occ initiate bathroom and goes on cammode Incontinent regularly Some constipation---still on the miralax  Current Outpatient Prescriptions on File Prior to Visit  Medication Sig Dispense Refill  . aspirin 325 MG tablet Take 162 mg by mouth 2 (two) times daily.       . betaxolol (BETOPTIC-S) 0.25 % ophthalmic suspension Place 1 drop into the right eye 2 (two) times daily.       [name] escitalopram (LEXAPRO) 10 MG tablet Take 1 tablet (10 mg total) by mouth daily.  30 tablet  11  . levothyroxine (SYNTHROID, LEVOTHROID) 100 MCG tablet Take 100 mcg by mouth daily.        [name] LORazepam (ATIVAN) 0.5 MG tablet Take 1-2 tablets (0.5-1 mg total) by mouth at bedtime as needed.  60 tablet  1  . polyethylene glycol (MIRALAX / GLYCOLAX) packet Take 17 g by mouth daily.  14 each  11  . Skin Protectants, Misc. (ENDIT) OINT Apply 1 application topically 3 (three) times daily as needed.   60 g  50  . sodium chloride (MURO 128) 2 % ophthalmic solution Place 1 drop into the right eye 3 (three) times daily.         Allergies  Allergen Reactions  . Acetazolamide     REACTION: diarrhea  . Cephalexin     REACTION: diarrhea  . Zolpidem Tartrate     Past Medical History  Diagnosis Date  . Allergy   . Depression   . Stroke   . Hyperlipidemia   . Hypothyroidism        . Hypertension   . CHF (congestive heart failure)     systolic  . Vascular dementia   . Constipation   . Breast cancer     Right  . Psychosis   . Seizure disorder     Past Surgical History  Procedure Date  . Appendectomy   . Eye surgery     cataract surgery    Family History  Problem Relation Age of Onset  . Alzheimer's disease Sister   . Alzheimer's disease Sister     History   Social History  . Marital Status: Widowed    Spouse Name: N/A    Number of Children: 1  . Years of Education: N/A   Occupational History  . sheet metal worker     retired  Social History Main Topics  . Smoking status: Former Games developer  . Smokeless tobacco: Not on file  . Alcohol Use: No  . Drug Use: Not on file  . Sexually Active: Not on file   Other Topics Concern  . Not on file   Social History Narrative   Lives with daughterShe has health care POAHas [facility]   Review of Systems No skin breakdown Still with some red areas on back and bottom Using Endit cream with some success Some trouble swallowing---they are sticking with soft foods (recently stopped sausage)    Objective:   Physical Exam  Constitutional: He appears well-developed and well-nourished.  Neck: No thyromegaly present.  Cardiovascular: Normal rate, regular rhythm, normal heart sounds and intact distal pulses.  Exam reveals no gallop.   No murmur heard. Pulmonary/Chest: Effort normal and breath sounds normal. No respiratory distress. He has no wheezes. He has no rales.  Abdominal: Soft. There is no tenderness.    Musculoskeletal: He exhibits no edema.  Lymphadenopathy:    He has no cervical adenopathy.  Neurological:       Mostly keeps eyes closed Does have brief verbal answers ?slightly increased tone  Skin:       Scattered bruises Open area on left forehead (old surgery site that he plays with)          Assessment & Plan:

## 2011-06-10 NOTE — Assessment & Plan Note (Signed)
Not apparent despite off lasix Apnea related to dementia but no symptoms from the CHF Is still on aspirin

## 2011-06-10 NOTE — Assessment & Plan Note (Signed)
Remains on Rx No labs due to homebound status

## 2011-06-10 NOTE — Assessment & Plan Note (Signed)
Severe and fairly stable Discussed donepezil---will try off Continues at home with 24 hour care

## 2011-06-10 NOTE — Assessment & Plan Note (Signed)
Doing okay on the miralax

## 2011-06-10 NOTE — Assessment & Plan Note (Signed)
Hard to assess  Still some agitation Will continue the lexapro for now

## 2011-08-26 ENCOUNTER — Ambulatory Visit: Payer: Medicare Other | Admitting: Internal Medicine

## 2011-08-26 ENCOUNTER — Encounter: Payer: Self-pay | Admitting: Internal Medicine

## 2011-08-26 VITALS — BP 118/60 | HR 84 | Resp 16

## 2011-08-26 DIAGNOSIS — F329 Major depressive disorder, single episode, unspecified: Secondary | ICD-10-CM

## 2011-08-26 DIAGNOSIS — I5022 Chronic systolic (congestive) heart failure: Secondary | ICD-10-CM

## 2011-08-26 DIAGNOSIS — F0150 Vascular dementia without behavioral disturbance: Secondary | ICD-10-CM

## 2011-08-26 DIAGNOSIS — F29 Unspecified psychosis not due to a substance or known physiological condition: Secondary | ICD-10-CM

## 2011-08-26 DIAGNOSIS — I6990 Unspecified sequelae of unspecified cerebrovascular disease: Secondary | ICD-10-CM

## 2011-08-26 DIAGNOSIS — E039 Hypothyroidism, unspecified: Secondary | ICD-10-CM

## 2011-08-26 NOTE — Assessment & Plan Note (Signed)
Ere and has taken a major step downward Will ask hospice to come back out to pick him up again Rarely awake Unable to transfer Clearly needs more care Hospice help may allow daughter to continue to care for him in her home

## 2011-08-26 NOTE — Assessment & Plan Note (Signed)
Doesn't seem to have fluid overload

## 2011-08-26 NOTE — Assessment & Plan Note (Signed)
Still with visual hallucinations but only brief agitation with this Generally hasn't needed the lorazepam

## 2011-08-26 NOTE — Progress Notes (Signed)
Subjective:    Patient ID: Micheal Townsend, male    DOB: 16-Dec-1916, 78 y.o.   MRN: [id number]  HPI Home visit [name] are here --daughter and caregiver here  Has been sleeping more Would sleep constantly if they didn't get him up Able to transfer with 2 people to wheelchair--and then to recliner They can barely do this now as he doesn't bear weight really No more good days Will eat some, then right back to sleep  Appetite is not as good. Even says no to ice cream sandwiches sometimes  Still has the hallucinations Has had 2 episodes of severe agitation since my last visit Able to calm down without the lorazepam. Will strike out with arms but they just keep distance then Gets angry sounding at times when getting some care Still able to get him in shower in chair but not tolerating it as well (just won't stand anymore)  They try bringing him to bathroom by routine----often successful Still with intermittent incontinence  Current Outpatient Prescriptions on File Prior to Visit  Medication Sig Dispense Refill  . aspirin 325 MG tablet Take 162 mg by mouth 2 (two) times daily.       . betaxolol (BETOPTIC-S) 0.25 % ophthalmic suspension Place 1 drop into the right eye 2 (two) times daily.       [name] escitalopram (LEXAPRO) 10 MG tablet Take 1 tablet (10 mg total) by mouth daily.  30 tablet  11  . levothyroxine (SYNTHROID, LEVOTHROID) 100 MCG tablet Take 1 tablet (100 mcg total) by mouth daily.  90 tablet  3  . LORazepam (ATIVAN) 0.5 MG tablet Take 1-2 tablets (0.5-1 mg total) by mouth at bedtime as needed.  60 tablet  1  . polyethylene glycol (MIRALAX / GLYCOLAX) packet Take 17 g by mouth daily.  14 each  11  . Skin Protectants, Misc. (ENDIT) OINT Apply 1 application topically 3 (three) times daily as needed.  60 g  50  . sodium chloride (MURO 128) 2 % ophthalmic solution Place 1 drop into the right eye 3 (three) times daily.         Allergies  Allergen Reactions  . Acetazolamide    REACTION: diarrhea  . Cephalexin     REACTION: diarrhea  . Zolpidem Tartrate     Past Medical History  Diagnosis Date  . Allergy   . Depression   . Stroke   . Hyperlipidemia   . Hypothyroidism        . Hypertension   . CHF (congestive heart failure)     systolic  . Vascular dementia   . Constipation   . Breast cancer     Right  . Psychosis   . Seizure disorder     Past Surgical History  Procedure Date  . Appendectomy   . Eye surgery     cataract surgery    Family History  Problem Relation Age of Onset  . Alzheimer's disease Sister   . Alzheimer's disease Sister     History   Social History  . Marital Status: Widowed    Spouse Name: N/A    Number of Children: 1  . Years of Education: N/A   Occupational History  . sheet metal worker     retired   Social History Main Topics  . Smoking status: Former Games developer  . Smokeless tobacco: Not on file  . Alcohol Use: No  . Drug Use: Not on file  . Sexually Active: Not on file  Other Topics Concern  . Not on file   Social History Narrative   Lives with daughterShe has health care POAHas [facility] tube feeds   Review of Systems Does seem to have lost weight No sig edema No SOB but still coughs a fair bit---??choking (mostly at night in bed). Not with food but occ with liquids Did use thickened liquids in past--may need to try this again  Still on oxygen--using it most of the time Has had a couple of ulcers---healed over time with topical Rx (ketoconazole/steroid compound)    Objective:   Physical Exam  Constitutional: He appears well-developed. No distress.       Somnolent Doesn't really engage  Neck: Normal range of motion.  Cardiovascular: Normal rate, regular rhythm and normal heart sounds.  Exam reveals no gallop.   No murmur heard. Pulmonary/Chest: Effort normal and breath sounds normal. No respiratory distress. He has no wheezes. He has no rales.  Abdominal: Soft. There is no tenderness.    Musculoskeletal: He exhibits no edema.  Lymphadenopathy:    He has no cervical adenopathy.  Neurological:       INcreased tone in extremities Little volitional movement now           Assessment & Plan:

## 2011-08-26 NOTE — Assessment & Plan Note (Signed)
Remains on the aspirin Has some dysphagia They will try nectar thick liquids again

## 2011-08-26 NOTE — Assessment & Plan Note (Signed)
Continues on the med 

## 2011-08-26 NOTE — Assessment & Plan Note (Signed)
Very hard to judge this now Will try decreasing to every other day and stop in a month if there is no difference

## 2011-09-07 ENCOUNTER — Telehealth: Payer: Self-pay

## 2011-09-07 DIAGNOSIS — R1310 Dysphagia, unspecified: Secondary | ICD-10-CM

## 2011-09-07 DIAGNOSIS — F0390 Unspecified dementia without behavioral disturbance: Secondary | ICD-10-CM

## 2011-09-07 DIAGNOSIS — R634 Abnormal weight loss: Secondary | ICD-10-CM

## 2011-09-07 NOTE — Telephone Encounter (Signed)
Discussed with daughter Will decrease the ASA to 81mg  daily  Will go back to daily with the lexapro and not wean  Geraldine Contras, Please call [name] to let her know of these changes

## 2011-09-07 NOTE — Telephone Encounter (Signed)
[name] with Hospice [phone] said pts daughter called her and pt is picking at CA skin lesions on face causing them to bleed. [name] wondered if could decrease his Baby ASA. Pt presently taking 4 Baby ASA daily. Also pt is more agitated and "acting up more". Wondered if Lexapro could be increased .[name] said she was not sure what mg of Lexapro and how often pt is taking. [name] thinks Dr Alphonsus Sias decreased Lexapro at last home visit 08/26/11. Please call [name] with hospice at [phone].

## 2011-09-08 NOTE — Telephone Encounter (Signed)
Left message on VM for hospice nurse and advised results

## 2011-10-09 ENCOUNTER — Telehealth: Payer: Self-pay

## 2011-10-09 MED ORDER — ALPRAZOLAM 0.25 MG PO TABS: 0.2500 mg | ORAL_TABLET | Freq: Three times a day (TID) | ORAL | Status: DC | PRN

## 2011-10-09 NOTE — Telephone Encounter (Signed)
Thelma with [facility] said pt has dementia and has increased agitation.Requesting med for agitation. Ativan has been tried and does not work for pt. [facility] is pharmacy and pt last seen 08/26/11. Wilnette Kales can be reached at [phone]. Thelma also asked if Dr Alphonsus Sias would like to write order for Hospice doctor to help with management of pts care.Please advise.

## 2011-10-09 NOTE — Telephone Encounter (Signed)
I discussed with daughter Was yelling out and distressed with bed bath  No physical aggression Agitation with touching other times as well Lorazepam hasn't really worked for this in past  Discussed decreasing stimulation---lights down, soft music, etc Will try xanax instead--before baths  Dee, Please send Rx for alprazolam 0.25 1-2 tid prn for agitation #60 x 0 To [facility] Take lorazepam off list  Let hospice nurse know the plan I do not want the hospice doctor to assist with care

## 2011-10-09 NOTE — Telephone Encounter (Signed)
rx called into pharmacy Left detailed message as stated below for hospice nurse, advised her to call if any questions

## 2011-10-28 ENCOUNTER — Telehealth: Payer: Self-pay | Admitting: *Deleted

## 2011-10-28 NOTE — Telephone Encounter (Signed)
Hospice nurse calling asking for a rx for congestion, cough and seasonal allergies, per nurse pt has congestion enough to make him vomit. Also pt's restlessness and aggitation is not better, the xanax is not helping it actually made pt worse per nurse. Nurse requests haloperidol 0.5 mg tab take 1/2-1 tab every 4-6 hours prn restlessness and aggitation, this is her suggestion. Per nurse if Dr.Letvak has another medication they will try.

## 2011-10-29 NOTE — Telephone Encounter (Signed)
Spoke with nurse and advise results, nurse wanted rx's sent to [facility], please advise on quantity. Also pt's regular nurse has gone to prn status and Angelica Chessman will not be his regular nurse, per [facility] call [phone] when Pearlie Oyster is going out to see pt.

## 2011-10-29 NOTE — Telephone Encounter (Signed)
Okay haloperidol 0.5mg  #30 x 2 1 every 6 hours prn for agitation  The loratadine is OTC

## 2011-10-29 NOTE — Telephone Encounter (Signed)
They can try loratadine 10mg  daily prn for allergy symptoms and congestion  Okay to try the prn haldol---please update med list and let her know

## 2011-10-30 ENCOUNTER — Telehealth: Payer: Self-pay | Admitting: Internal Medicine

## 2011-10-30 MED ORDER — MORPHINE SULFATE (CONCENTRATE) 20 MG/ML PO SOLN: 5.0000 mg | ORAL | Status: DC | PRN

## 2011-10-30 MED ORDER — HALOPERIDOL 0.5 MG PO TABS: 0.5000 mg | ORAL_TABLET | Freq: Four times a day (QID) | ORAL | Status: DC | PRN

## 2011-10-30 NOTE — Telephone Encounter (Signed)
Caller: [name]/Child; PCP: [facility] Abide I.; CB#: [phone];  Call regarding Cough/Congestion; Onset: 10/26/11.  Afebrile.  On hospice; lungs clear to auscultation 10/28/11.  Too weak to cough up phlem. One soft diarrhea stool 10/30/11. Appetite decreased all week but ate egg this morning.  No way to check vital signs.  Since 2300 10/29/11, noted very slow to respond, remains drowsy, skin clammy at times. Family called hospice nurse who is transitioning pt to another hospice nurse; hospice nurse will check in with family about 1100.  No emergency pack in home.  RN called Dr Alphonsus Sias r/t 911 disposition for pt on hospice for new or worsening signs and symptoms that may indicate shock per Breathing Problems Guideline.  Dee/office nurse advised to call 911 now for evaluation of hospice pt.  Also advised caller to notifiy hospice that MD office suggested 911 for symptoms to see if they can see pt sooner.

## 2011-10-30 NOTE — Telephone Encounter (Signed)
Tried home and cell numbers for daughter Not sure hospital makes sense since he seems to be at the end of his life

## 2011-10-30 NOTE — Telephone Encounter (Signed)
rx sent to pharmacy by e-script  

## 2011-10-30 NOTE — Telephone Encounter (Signed)
[name] [phone] from Hospice called regarding pt, she is his primary nurse and she is saying that pt doesn't really need to go to hospital because she is going to see him later today but she says his cough is really bad. She was wondering if Tussinex could be called in for pt and she says it will calm him down as well. They order some Claritin too.   He uses [facility] and she says please contact her at the number above if you need anything.

## 2011-10-30 NOTE — Telephone Encounter (Signed)
Spoke to daughter Recommended supportive care since he is at end of life Will Rx roxanol for prn use

## 2011-11-05 DIAGNOSIS — R1310 Dysphagia, unspecified: Secondary | ICD-10-CM

## 2011-11-05 DIAGNOSIS — F0390 Unspecified dementia without behavioral disturbance: Secondary | ICD-10-CM

## 2011-11-05 DIAGNOSIS — R634 Abnormal weight loss: Secondary | ICD-10-CM

## 2011-11-09 ENCOUNTER — Telehealth: Payer: Self-pay | Admitting: Internal Medicine

## 2011-11-09 NOTE — Telephone Encounter (Signed)
okay

## 2011-11-09 NOTE — Telephone Encounter (Signed)
[name] called to let you know she will be at patient's home on 11/11/11 @ 2:30.  If the time changes,please call [name] at [phone].

## 2011-11-11 ENCOUNTER — Encounter: Payer: Self-pay | Admitting: Internal Medicine

## 2011-11-11 ENCOUNTER — Ambulatory Visit: Payer: Medicare Other | Admitting: Internal Medicine

## 2011-11-11 VITALS — BP 98/56 | HR 72 | Resp 14

## 2011-11-11 DIAGNOSIS — F29 Unspecified psychosis not due to a substance or known physiological condition: Secondary | ICD-10-CM

## 2011-11-11 DIAGNOSIS — I5022 Chronic systolic (congestive) heart failure: Secondary | ICD-10-CM

## 2011-11-11 DIAGNOSIS — F0150 Vascular dementia without behavioral disturbance: Secondary | ICD-10-CM

## 2011-11-11 DIAGNOSIS — I10 Essential (primary) hypertension: Secondary | ICD-10-CM

## 2011-11-11 NOTE — Assessment & Plan Note (Signed)
Has worsened Mostly bed bound Back on hospice Reviewed care with Lona Kettle and family

## 2011-11-11 NOTE — Assessment & Plan Note (Signed)
Doesn't seem to be a problem at this point

## 2011-11-11 NOTE — Assessment & Plan Note (Signed)
BP Readings from Last 3 Encounters:  11/11/11 98/56  08/26/11 118/60  06/10/11 134/52   Low now  no Rx needed

## 2011-11-11 NOTE — Assessment & Plan Note (Signed)
Doesn't seem to be as apparent Has haloperidol if worsens

## 2011-11-11 NOTE — Progress Notes (Signed)
Subjective:    Patient ID: Micheal Townsend, male    DOB: 10-Nov-1916, 78 y.o.   MRN: [id number]  HPI [name]'s ---daughter and caregiver are here [facility] hospice nurse also here  Status has deteriorated Now just about bed bound---bed now in living room Has alternating pressure mattress  Has non healing left temple lesion Scabbed at times and he scratches Some blood from this got into left ear----trying mineral oil and will flush  Sleeps most of the time Do get him in the shower once in a while--on a good day Otherwise just bed baths  Mild agitation with personal care Will push them away with his fist--but doesn't hit Xanax never helped Hasn't been bad enough to try the haldol  Not eating well Has some good days and he will eat continuously Other days---very little  Has had some cough Not clearly sick but "hasn't come back from that" Used the claritin for a while and it helped Discussed trying the roxanol if he has trouble with the cough  Doesn't really help with pivots and transferring much Even just trying to get him into the wheelchair can be challenging Incontinent of bowel and bladder mostly---once in a while will make it to the bathroom Current Outpatient Prescriptions on File Prior to Visit  Medication Sig Dispense Refill  . aspirin 81 MG tablet Take 81 mg by mouth daily.      . betaxolol (BETOPTIC-S) 0.25 % ophthalmic suspension Place 1 drop into the right eye 2 (two) times daily.       [name] escitalopram (LEXAPRO) 10 MG tablet Take 1 tablet (10 mg total) by mouth daily.  30 tablet  11  . haloperidol (HALDOL) 0.5 MG tablet Take 1-2 tablets (0.5-1 mg total) by mouth every 6 (six) hours as needed.  30 tablet  2  . levothyroxine (SYNTHROID, LEVOTHROID) 100 MCG tablet Take 1 tablet (100 mcg total) by mouth daily.  90 tablet  3  . loratadine (CLARITIN) 10 MG tablet Take 10 mg by mouth daily as needed.      [name] morphine (ROXANOL) 20 MG/ML concentrated solution Take 0.25-0.5 mLs  (5-10 mg total) by mouth every hour as needed for pain.  30 mL  0  . polyethylene glycol (MIRALAX / GLYCOLAX) packet Take 17 g by mouth daily.  14 each  11  . Skin Protectants, Misc. (ENDIT) OINT Apply 1 application topically 3 (three) times daily as needed.  60 g  50  . sodium chloride (MURO 128) 2 % ophthalmic solution Place 1 drop into the right eye 3 (three) times daily.         Allergies  Allergen Reactions  . Acetazolamide     REACTION: diarrhea  . Cephalexin     REACTION: diarrhea  . Zolpidem Tartrate     Past Medical History  Diagnosis Date  . Allergy   . Depression   . Stroke   . Hyperlipidemia   . Hypothyroidism        . Hypertension   . CHF (congestive heart failure)     systolic  . Vascular dementia   . Constipation   . Breast cancer     Right  . Psychosis   . Seizure disorder     Past Surgical History  Procedure Date  . Appendectomy   . Eye surgery     cataract surgery    Family History  Problem Relation Age of Onset  . Alzheimer's disease Sister   . Alzheimer's disease Sister  History   Social History  . Marital Status: Widowed    Spouse Name: N/A    Number of Children: 1  . Years of Education: N/A   Occupational History  . sheet metal worker     retired   Social History Main Topics  . Smoking status: Former Games developer  . Smokeless tobacco: Not on file  . Alcohol Use: No  . Drug Use: Not on file  . Sexually Active: Not on file   Other Topics Concern  . Not on file   Social History Narrative   Lives with daughterShe has health care POAHas DNRNo tube feeds   Review of Systems More sores--like behind ears from oxygen Not constipated     Objective:   Physical Exam  Constitutional: He appears well-developed. No distress.       Sleeping in bed but does respond to voice  Seems satisfied  Neck: No thyromegaly present.  Cardiovascular: Normal rate, regular rhythm and normal heart sounds.  Exam reveals no gallop.   No murmur  heard. Pulmonary/Chest: Effort normal and breath sounds normal. No respiratory distress. He has no wheezes. He has no rales.  Abdominal: Soft. There is no tenderness.  Musculoskeletal:       No active synovitis Mild contractures at knees  Lymphadenopathy:    He has no cervical adenopathy.  Neurological:       Tone seems normal when he helps ROM Can move all 4 but is passive  Skin:       Left forehead elliptical lesion has clean base and some coagulated blood-no inflammation  Psychiatric:       Seems satisfied          Assessment & Plan:

## 2011-12-22 ENCOUNTER — Telehealth: Payer: Self-pay

## 2011-12-22 MED ORDER — DIAZEPAM 10 MG RE GEL: 5.0000 mg | Freq: Once | RECTAL | Status: DC

## 2011-12-22 NOTE — Telephone Encounter (Signed)
French Ana with Hospice at pt's home; 8 am this morning pt had 2 grand mal seizures back to back while bathing; EMS  got pt to bed. Now pt appears stable, conscious, acting normal, pupils reactive and equal. BP now 90/50 and sat is 94% on 2.5 L of O2. French Ana noted pt has not had seizure in 1 1/2 years, not on seizure med. Also pt has redness and swelling between right jaw and cheek; no pain and no fever. French Ana wants to know if Dr Alphonsus Sias wants to start med for seizure and antibiotic for ? Rt jaw infection.[place].Please advise.

## 2011-12-22 NOTE — Telephone Encounter (Signed)
French Ana said pt was taking Depokote liquid 250 mg/5 ml three times a day but made pt very sleepy and stopped med in 2011. French Ana said pt appears stable now and will wait to hear from Dr Alphonsus Sias and will call back if any change or condition worsens.

## 2011-12-22 NOTE — Telephone Encounter (Signed)
French Ana with Hospice called back; family found record where neurologist Dr Anne Hahn while pt in hospital did not recommend Depokote due to pts fragile condition; suggested liquid form of Teppra.

## 2011-12-22 NOTE — Telephone Encounter (Signed)
Spoke to [place] his daughter Now is post ictal Had first one for 5 minutes and then seemed to stop but only slightly roused and then had another spell Discussed meds---will hold off on keppra for now but would restart it if seizures recur Will give rectal valium for prn use though  Has jaw swelling  Has tooth pocket and apparent infection  Will treat with augmentin Phoned to [facility]

## 2011-12-31 IMAGING — CT CT HEAD W/O CM
4 of 6 series · 18 of 30 positions shown, 19 images · non-contrast
Comparison: 09/29/2009

CLINICAL DATA: Recent pneumonia.  Altered mental status.

CT HEAD WITHOUT CONTRAST
TECHNIQUE: Contiguous axial images were obtained from the base of
the skull through the vertex without contrast.

[Series 2: head w/o · axial · non-contrast · 0.49mm/px · z∈[+235,+400]mm · 3 of 34 slices shown, 4 images]
[im 1/34  brain]
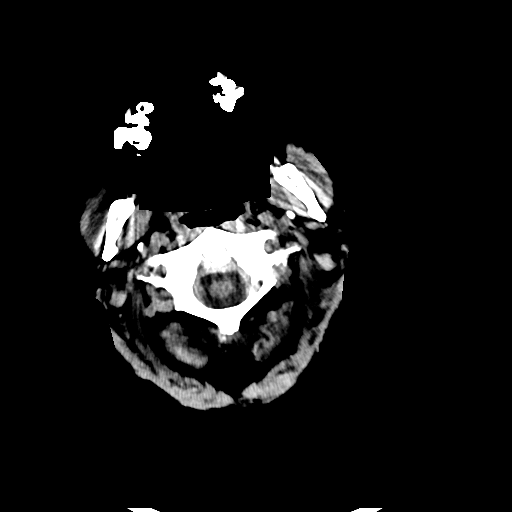
[im 1/34  bone]
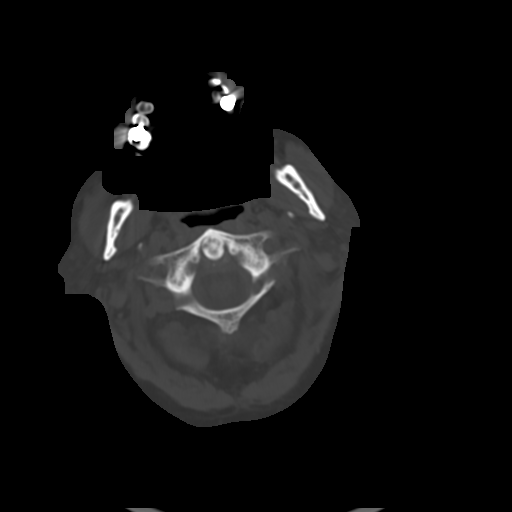
[im 17/34  brain]
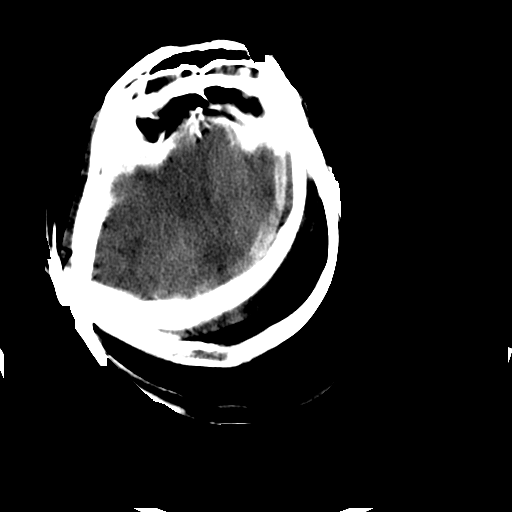
[im 34/34  brain]
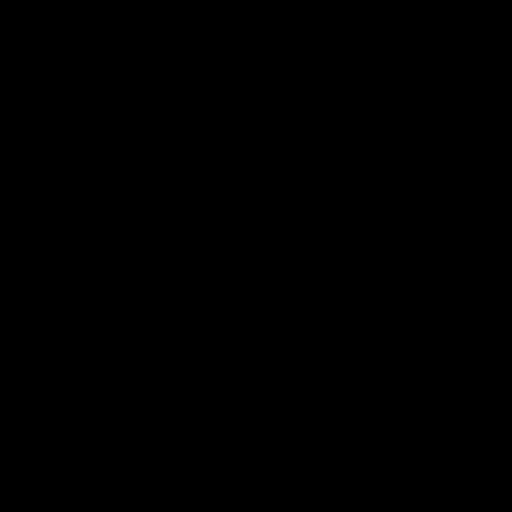

[Series 3: head w/o bone · axial · non-contrast · 0.49mm/px · z∈[+262,+372]mm · 5 of 67 slices shown (1 of 3)]
[im 12/67  bone]
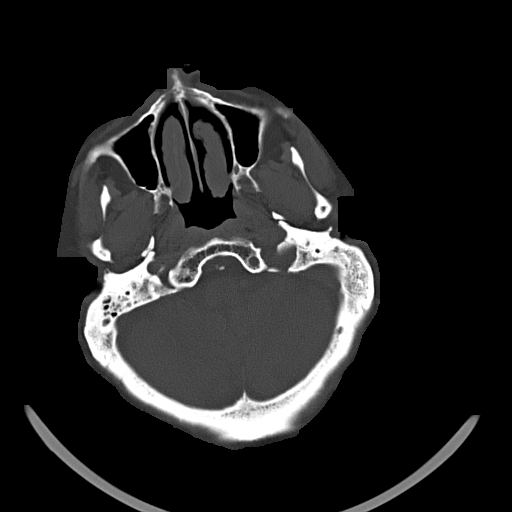
[im 23/67  bone]
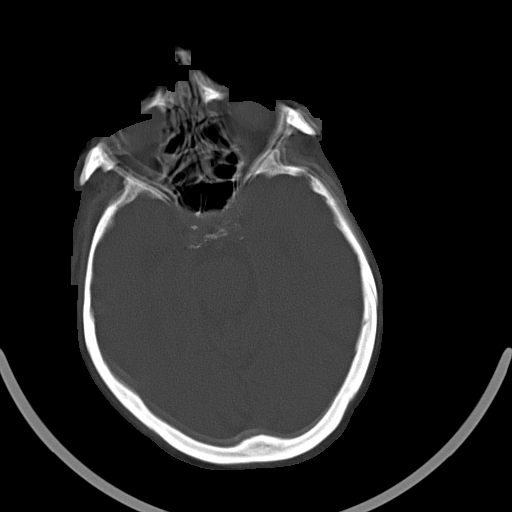
[im 34/67  bone]
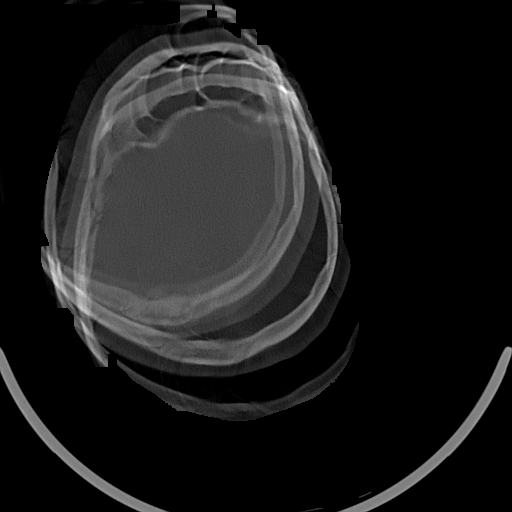
[im 45/67  bone]
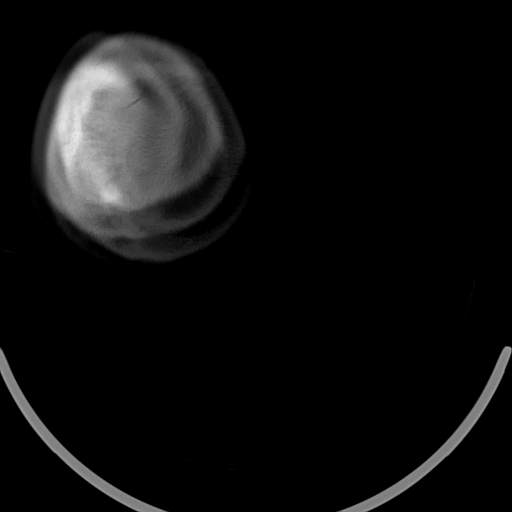
[im 56/67  bone]
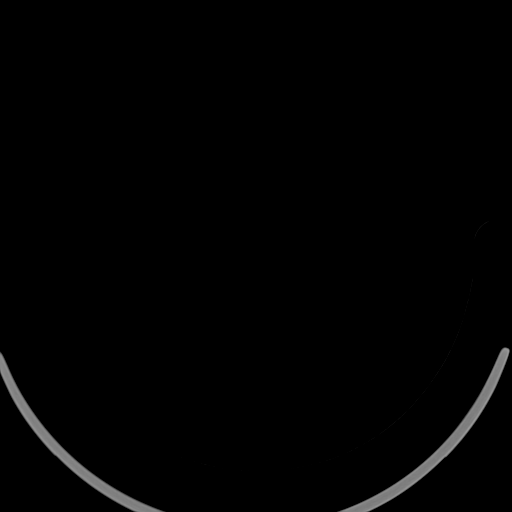

[Series 5: head w/o bone · axial · non-contrast · 0.49mm/px · z∈[+262,+372]mm · 5 of 67 slices shown (2 of 3)]
[im 12/67  bone]
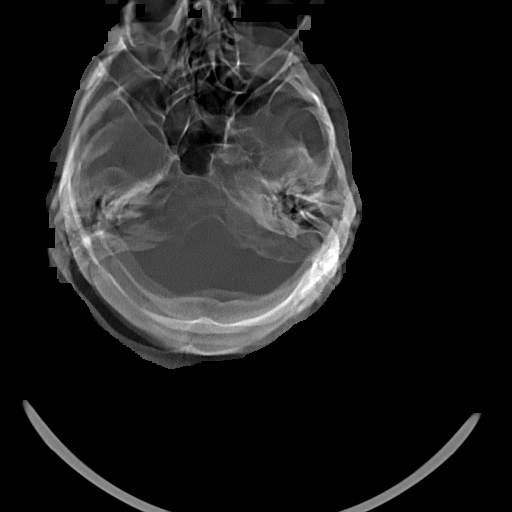
[im 23/67  bone]
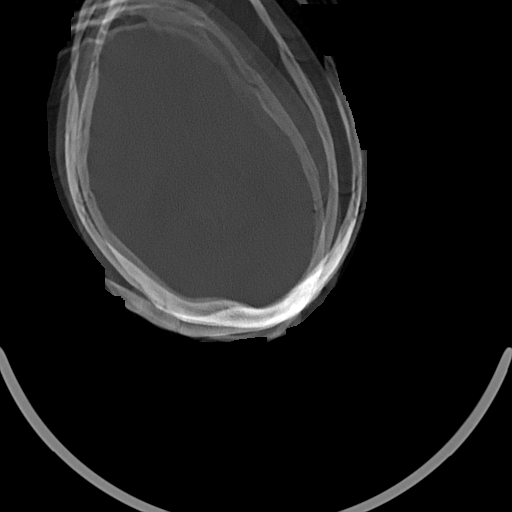
[im 34/67  bone]
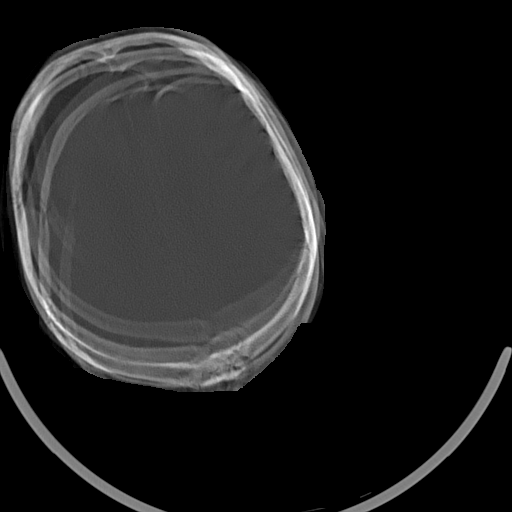
[im 45/67  bone]
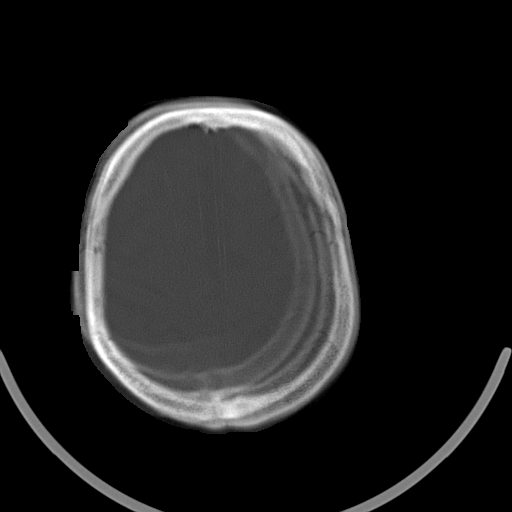
[im 56/67  bone]
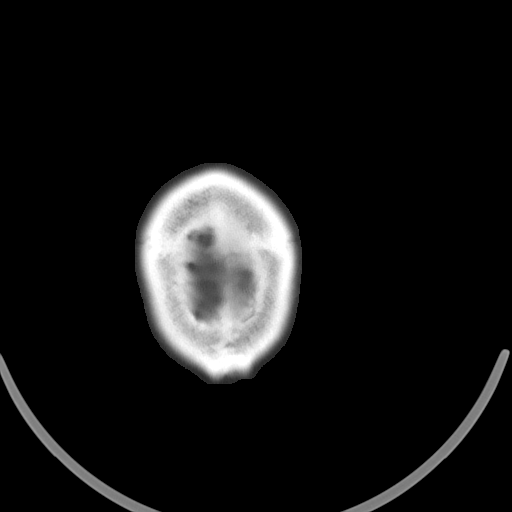

[Series 7: head w/o bone · axial · non-contrast · 0.49mm/px · z∈[+262,+372]mm · 5 of 67 slices shown (3 of 3)]
[im 12/67  bone]
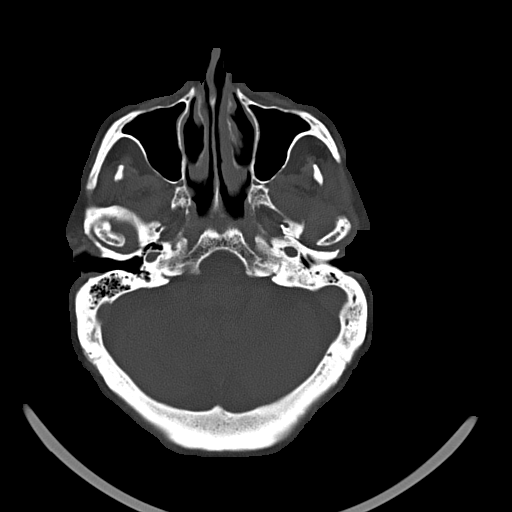
[im 23/67  bone]
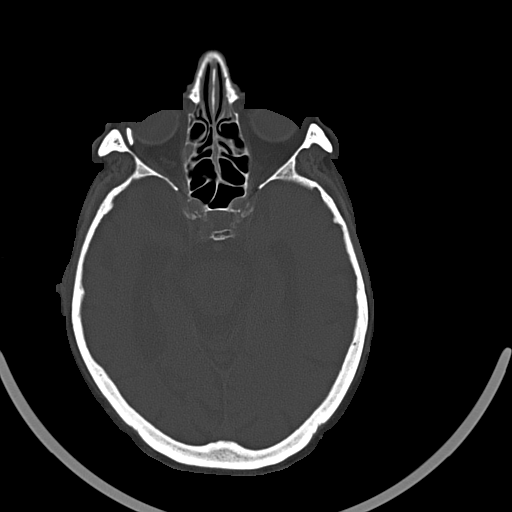
[im 34/67  bone]
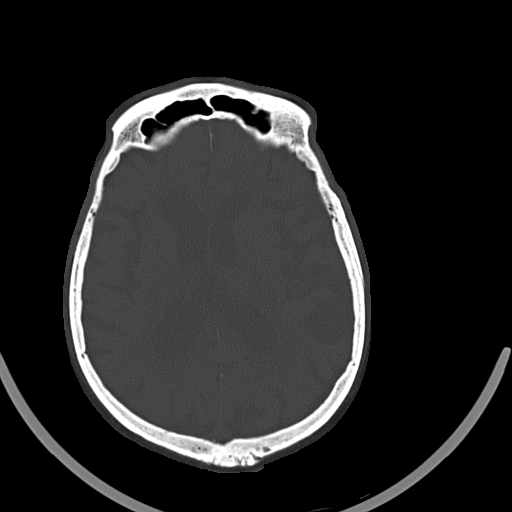
[im 45/67  bone]
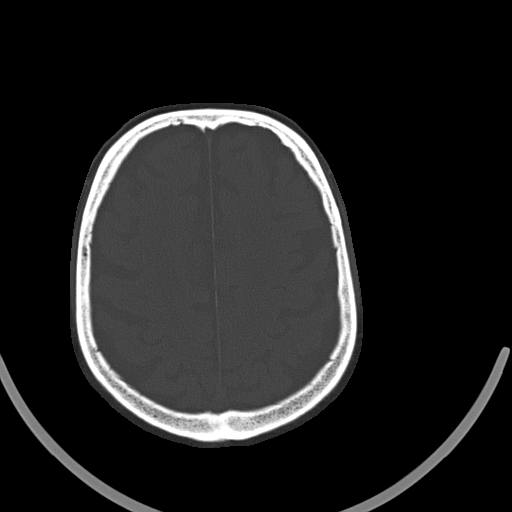
[im 56/67  bone]
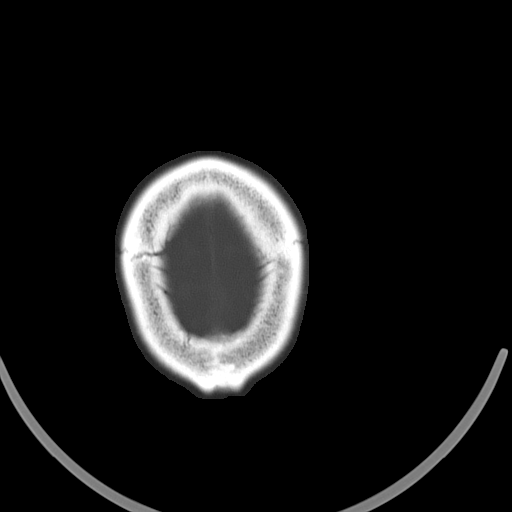

[18 of 30 positions shown; findings below may reference images not displayed]

FINDINGS: Image quality degraded by motion with multiple repeats.

Advanced brain atrophy.  Moderate chronic microvascular ischemia in
the white matter.  No acute infarct.  Negative for hemorrhage or
mass lesion.
IMPRESSION: Atrophy and chronic ischemic change in the white matter.  No acute
abnormality.

## 2011-12-31 IMAGING — CR DG CHEST 1V PORT
1 series · 1 of 1 positions shown · non-contrast
Comparison: 09/30/2009

CLINICAL DATA: Generalized weakness.

PORTABLE CHEST - 1 VIEW

[AP]
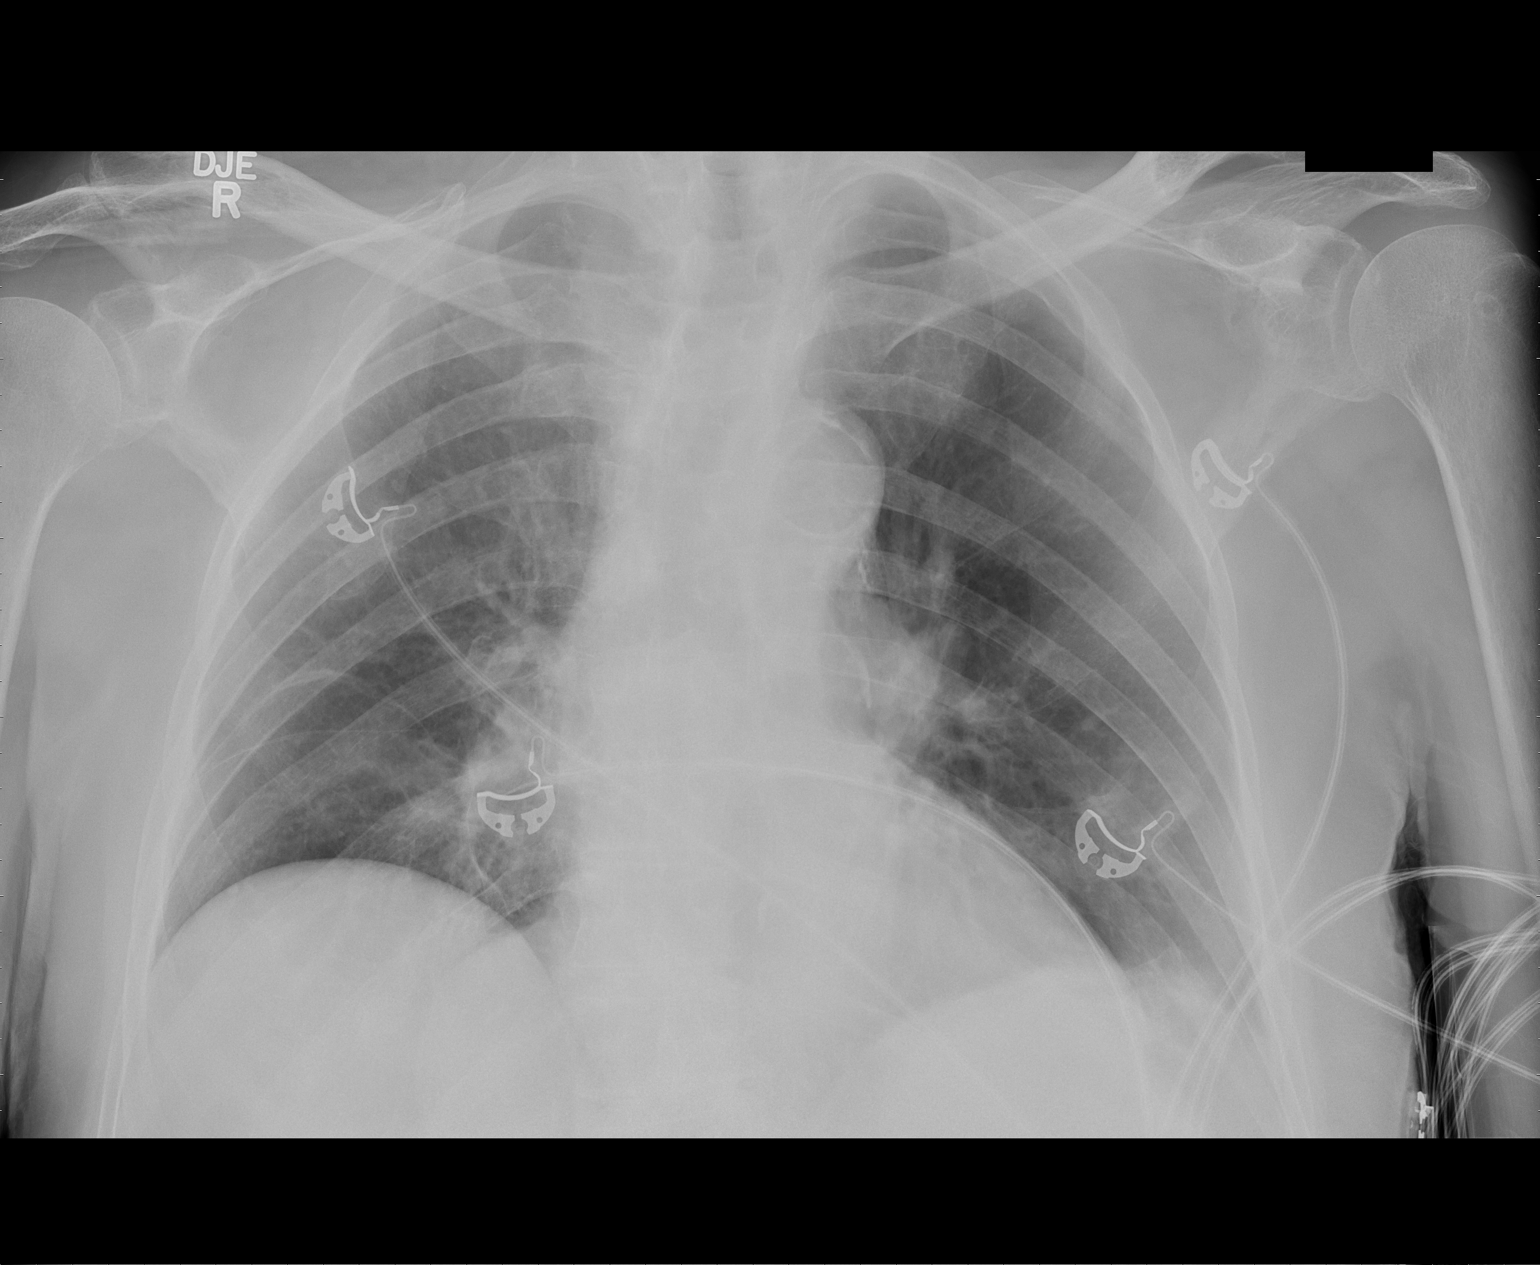

[1 of 1 positions shown; findings below may reference images not displayed]

FINDINGS: Heart is upper limits normal in size.  There is bibasilar
scarring.  No acute opacities or effusions.  No edema.  No acute
bony abnormality.
IMPRESSION: Bibasilar scarring.  No active disease.

## 2012-01-01 DIAGNOSIS — R634 Abnormal weight loss: Secondary | ICD-10-CM

## 2012-01-01 DIAGNOSIS — F0390 Unspecified dementia without behavioral disturbance: Secondary | ICD-10-CM

## 2012-01-01 DIAGNOSIS — R1310 Dysphagia, unspecified: Secondary | ICD-10-CM

## 2012-02-10 ENCOUNTER — Ambulatory Visit: Payer: Medicare Other | Admitting: Internal Medicine

## 2012-02-10 ENCOUNTER — Encounter: Payer: Self-pay | Admitting: Internal Medicine

## 2012-02-10 VITALS — BP 130/84 | HR 76 | Resp 16

## 2012-02-10 DIAGNOSIS — F329 Major depressive disorder, single episode, unspecified: Secondary | ICD-10-CM

## 2012-02-10 DIAGNOSIS — F0150 Vascular dementia without behavioral disturbance: Secondary | ICD-10-CM

## 2012-02-10 DIAGNOSIS — I5022 Chronic systolic (congestive) heart failure: Secondary | ICD-10-CM

## 2012-02-10 DIAGNOSIS — I6990 Unspecified sequelae of unspecified cerebrovascular disease: Secondary | ICD-10-CM

## 2012-02-10 DIAGNOSIS — I10 Essential (primary) hypertension: Secondary | ICD-10-CM

## 2012-02-10 NOTE — Assessment & Plan Note (Signed)
BP Readings from Last 3 Encounters:  02/10/12 130/84  11/11/11 98/56  08/26/11 118/60   Good control without meds

## 2012-02-10 NOTE — Progress Notes (Signed)
Subjective:    Patient ID: Micheal Townsend, male    DOB: [birth year], 78 y.o.   MRN: [id number]  HPI [name]--caregiver and daughter here Teresa--hospice nurse  Has had increased spells of extensive sleeping Still has some fired up days---now qumoite rare Will have ~weekly spells of agitation requiring the haldo   variable eating More often very poor appetite   OOB most days Then back to bed after lunch Occ leaves him in bed if he is very sleepy Weight bearing only to pivot into chair on transfers  Mostly bed baths May be able to shower when hospice aide is here (twice a week)  Seems to be satisfied Mood fine Current Outpatient Prescriptions on File Prior to Visit  Medication Sig Dispense Refill  . aspirin 81 MG tablet Take 81 mg by mouth daily.      . betaxolol (BETOPTIC-S) 0.25 % ophthalmic suspension Place 1 drop into the right eye 2 (two) times daily.       . diazepam (DIASTAT ACUDIAL) 10 MG GEL Place 5-10 mg rectally once.  20 mg  5  . escitalopram (LEXAPRO) 10 MG tablet Take 1 tablet (10 mg total) by mouth daily.  30 tablet  11  . haloperidol (HALDOL) 0.5 MG tablet Take 1-2 tablets (0.5-1 mg total) by mouth every 6 (six) hours as needed.  30 tablet  2  . ketotifen (ZADITOR) 0.025 % ophthalmic solution Place 1 drop into both eyes 2 (two) times daily.      [name] levothyroxine (SYNTHROID, LEVOTHROID) 100 MCG tablet Take 1 tablet (100 mcg total) by mouth daily.  90 tablet  3  . loratadine (CLARITIN) 10 MG tablet Take 10 mg by mouth daily as needed.      [name] morphine (ROXANOL) 20 MG/ML concentrated solution Take 0.25-0.5 mLs (5-10 mg total) by mouth every hour as needed for pain.  30 mL  0  . polyethylene glycol (MIRALAX / GLYCOLAX) packet Take 17 g by mouth daily.  14 each  11  . Skin Protectants, Misc. (ENDIT) OINT Apply 1 application topically 3 (three) times daily as needed.  60 g  50  . sodium chloride (MURO 128) 2 % ophthalmic solution Place 1 drop into the right eye 3 (three)  times daily.         Allergies  Allergen Reactions  . Acetazolamide     REACTION: diarrhea  . Cephalexin     REACTION: diarrhea  . Zolpidem Tartrate     Past Medical History  Diagnosis Date  . Allergy   . Depression   . Stroke   . Hyperlipidemia   . Hypothyroidism        . Hypertension   . CHF (congestive heart failure)     systolic  . Vascular dementia   . Constipation   . Breast cancer     Right  . Psychosis   . Seizure disorder     Past Surgical History  Procedure Date  . Appendectomy   . Eye surgery     cataract surgery    Family History  Problem Relation Age of Onset  . Alzheimer's disease Sister   . Alzheimer's disease Sister     History   Social History  . Marital Status: Widowed    Spouse Name: N/A    Number of Children: 1  . Years of Education: N/A   Occupational History  . sheet metal worker     retired   Social History Main Topics  . Smoking  status: Former Games developer  . Smokeless tobacco: Not on file  . Alcohol Use: No  . Drug Use: Not on file  . Sexually Active: Not on file   Other Topics Concern  . Not on file   Social History Narrative   Lives with daughterShe has health care POAHas DNRNo tube feeds   Review of Systems Re nasal drainage and runny eyes No apparent pain Bowels are fine---he goes most days     Objective:   Physical Exam  Constitutional: He appears well-developed. No distress.  Neck: No thyromegaly present.  Cardiovascular: Normal rate, regular rhythm and normal heart sounds.  Exam reveals no gallop.   No murmur heard. Pulmonary/Chest: Effort normal and breath sounds normal. No respiratory distress. He has no wheezes. He has no rales.  Abdominal: Soft. There is no tenderness.  Musculoskeletal: He exhibits no edema and no tenderness.  Lymphadenopathy:    He has no cervical adenopathy.  Neurological:       awake and quite verbal today No focal weakness  Skin:       Left forehead ulcer not bleeding Clean  base but larger than last time Vein on surface and at base  Psychiatric:       Seems satisfied          Assessment & Plan:

## 2012-02-10 NOTE — Assessment & Plan Note (Signed)
Mostly cognitive Will stop the aspirin due to persistent bleeding at left temple lesion

## 2012-02-10 NOTE — Assessment & Plan Note (Signed)
Mood seems to be okay when he is awake (which is less and less of the time) Continue the escitalopram

## 2012-02-10 NOTE — Assessment & Plan Note (Signed)
Severe Continues on hospice care More sleeping overall---but awake today for a change Total care

## 2012-02-10 NOTE — Assessment & Plan Note (Signed)
Neutral fluid status No Rx now

## 2012-02-17 ENCOUNTER — Ambulatory Visit (INDEPENDENT_AMBULATORY_CARE_PROVIDER_SITE_OTHER): Payer: Medicare Other

## 2012-02-17 DIAGNOSIS — Z23 Encounter for immunization: Secondary | ICD-10-CM

## 2012-03-21 ENCOUNTER — Other Ambulatory Visit: Payer: Self-pay | Admitting: *Deleted

## 2012-03-21 MED ORDER — POLYETHYLENE GLYCOL 3350 17 GM/SCOOP PO POWD: ORAL | Status: DC

## 2012-03-23 ENCOUNTER — Other Ambulatory Visit: Payer: Self-pay

## 2012-03-23 NOTE — Telephone Encounter (Signed)
Endit cream phoned in, not sure how to document it.

## 2012-03-23 NOTE — Telephone Encounter (Signed)
Please call them and renew for a year It is cream for bedsores and I have trouble getting it to come up on Epic

## 2012-03-23 NOTE — Telephone Encounter (Signed)
[name] with Hospice Endit refills expired 11/2011; request refills sent to [facility]. Could not list refill under meds & orders note popped up no longer available needs new order.Please advise.

## 2012-03-30 ENCOUNTER — Telehealth: Payer: Self-pay

## 2012-03-30 NOTE — Telephone Encounter (Signed)
Micheal Townsend with [facility] left v/m; pt has UTI symptoms, restless and more agitated than usual for 5 days, urine has strong odor. No fever. Pt also has fine rales lower left lung, non productive cough.Please advise.

## 2012-03-30 NOTE — Telephone Encounter (Signed)
Spoke to daughter [name] Hasn't been eating and drinking as much--probably concentrated urine Discussed antibiotics----will withhold antibiotics after discussion Will try roxanol for agitation and add haldol if needed (may have helped a little)  Discussed with Lona Kettle also

## 2012-04-06 ENCOUNTER — Telehealth: Payer: Self-pay | Admitting: Internal Medicine

## 2012-04-06 NOTE — Telephone Encounter (Signed)
Caller: Mandy/Other; Patient Name: Micheal Townsend; PCP: [facility]); Best Callback Phone Number: [phone].  Mandy from [facility] requesting and increase dosage on patients Haldol due to the current dosage of 0.5mg  1 tab q 4-5 hrs prn is not working well.  PLEASE F/U WITH  MANDY AT THE ABOVE NUMBER.  THANK YOU.

## 2012-04-06 NOTE — Telephone Encounter (Signed)
Spoke with nurse and pt is already taking that dose, she would like to increase to 1-2 mg q4h if possible. The pill size 0.5 so from your instructions he can take 1-2 tablets? And from the nurse request he would be taking 2-4 tablets? Please advise

## 2012-04-06 NOTE — Telephone Encounter (Signed)
Okay to increase to 0.5-1mg  every 4 hours as needed for agitation May need to try something else if this is not effective

## 2012-04-06 NOTE — Telephone Encounter (Signed)
Spoke with hospice nurse and advised results, she will have them finish out the 0.5 and will call for refill

## 2012-04-06 NOTE — Telephone Encounter (Signed)
Can change to the 1mg  dose and have it be 1-2 q 4 hours #120 x 0

## 2012-04-20 ENCOUNTER — Telehealth: Payer: Self-pay | Admitting: *Deleted

## 2012-04-20 NOTE — Telephone Encounter (Signed)
Hospice nurse called stating patient has been restless and agiated for the last 72 hours, pt taking all his clothes off and acting out. [name] pt's daughter was giving his haldol every 4 hours, nurse asked patient if he was in pain and pt said yes, she asked him if he wanted something for pain and he said yes, nurse confirmed ok to give pain med with daughter. Nurse gave 0.2 morphine and pt calmed down and has been sleep for the last hour or so, per nurse she thinks it may be terminal agitation or pt's dementia but pt was in pain. Per nurse she drew up some morphine syringes for the daughter to administer and advised daughter to keep the mediation in him. Hospice nurse just wanted to keep Dr.Letvak informed.

## 2012-04-25 NOTE — Telephone Encounter (Signed)
Discussed with daughter Did get help with the morphine--now acting up again She will try again and I will make visit 12/4

## 2012-04-27 ENCOUNTER — Encounter: Payer: Self-pay | Admitting: Internal Medicine

## 2012-04-27 ENCOUNTER — Ambulatory Visit: Payer: Medicare Other | Admitting: Internal Medicine

## 2012-04-27 VITALS — BP 130/70 | HR 80 | Resp 20

## 2012-04-27 DIAGNOSIS — I5022 Chronic systolic (congestive) heart failure: Secondary | ICD-10-CM

## 2012-04-27 DIAGNOSIS — F329 Major depressive disorder, single episode, unspecified: Secondary | ICD-10-CM

## 2012-04-27 DIAGNOSIS — F0150 Vascular dementia without behavioral disturbance: Secondary | ICD-10-CM

## 2012-04-27 DIAGNOSIS — F29 Unspecified psychosis not due to a substance or known physiological condition: Secondary | ICD-10-CM

## 2012-04-27 MED ORDER — FLUOCINONIDE 0.05 % EX CREA: TOPICAL_CREAM | Freq: Two times a day (BID) | CUTANEOUS | Status: DC | PRN

## 2012-04-27 NOTE — Progress Notes (Signed)
Subjective:    Patient ID: Micheal Townsend, male    DOB: [birth year], 78 y.o.   MRN: [id number]  HPI [name]'s here ---daughter and caregiver Hospice nurse [name] here also  Has had increased agitation Required increasing doses of haldol No clear pain but when queried he stated yes to pain Got morphine and went right to sleep Has gotten 2 other doses of morphine but with less response Lorazepam and xanax would hype him up more when tried  No longer has any normal days May be cordial one moment and then mean-- brandishing fists (this isn't as often) Pulls all his clothes off and throws them out of bed Goes back 2-3 weeks  No longer sleeps for any length of time--except after that 1st morphine dose  Hard to judge mood Didn't tolerate attempt to wean  Current Outpatient Prescriptions on File Prior to Visit  Medication Sig Dispense Refill  . betaxolol (BETOPTIC-S) 0.25 % ophthalmic suspension Place 1 drop into the right eye 2 (two) times daily.       . diazepam (DIASTAT ACUDIAL) 10 MG GEL Place 5-10 mg rectally once.  20 mg  5  . escitalopram (LEXAPRO) 10 MG tablet Take 1 tablet (10 mg total) by mouth daily.  30 tablet  11  . haloperidol (HALDOL) 1 MG tablet Take 1-2 mg by mouth every 4 (four) hours as needed.      [name] ketotifen (ZADITOR) 0.025 % ophthalmic solution Place 1 drop into both eyes 2 (two) times daily.      [name] levothyroxine (SYNTHROID, LEVOTHROID) 100 MCG tablet Take 1 tablet (100 mcg total) by mouth daily.  90 tablet  3  . morphine (ROXANOL) 20 MG/ML concentrated solution Take 0.25-0.5 mLs (5-10 mg total) by mouth every hour as needed for pain.  30 mL  0  . polyethylene glycol powder (GLYCOLAX/MIRALAX) powder Dissolve one capful in 8 ounces of fluid everyday.  255 g  6  . Skin Protectants, Misc. (ENDIT EX) Apply 1 application topically 3 (three) times daily as needed.      . sodium chloride (MURO 128) 2 % ophthalmic solution Place 1 drop into the right eye 3 (three) times daily.          Allergies  Allergen Reactions  . Acetazolamide     REACTION: diarrhea  . Cephalexin     REACTION: diarrhea  . Zolpidem Tartrate     Past Medical History  Diagnosis Date  . Allergy   . Depression   . Stroke   . Hyperlipidemia   . Hypothyroidism        . Hypertension   . CHF (congestive heart failure)     systolic  . Vascular dementia   . Constipation   . Breast cancer     Right  . Psychosis   . Seizure disorder     Past Surgical History  Procedure Date  . Appendectomy   . Eye surgery     cataract surgery    Family History  Problem Relation Age of Onset  . Alzheimer's disease Sister   . Alzheimer's disease Sister     History   Social History  . Marital Status: Widowed    Spouse Name: N/A    Number of Children: 1  . Years of Education: N/A   Occupational History  . sheet metal worker     retired   Social History Main Topics  . Smoking status: Former Games developer  . Smokeless tobacco: Not on file  .  Alcohol Use: No  . Drug Use: Not on file  . Sexually Active: Not on file   Other Topics Concern  . Not on file   Social History Narrative   Lives with daughterShe has health care POAHas [facility] tube feeds    Review of Systems New open area on knee---probably from thrashing around Some trouble with pills--can't really swallow Eating is very variable---nothing one day and constantly wants something the next day (sweet juice will help when he has craving for something in his mouth) Pocketing food---needs prompting multiple times to swallow. Now to chopped chicken--soft doesn't work Bowels have been okay with prunes and miralax Voiding okay but very strong urine    Objective:   Physical Exam  Constitutional:       Sleeping Some verbalization to touch Yells some with movement of extremities and turning him  Neck: Normal range of motion.  Cardiovascular: Normal rate, regular rhythm and normal heart sounds.   Pulmonary/Chest: Effort normal and  breath sounds normal. No respiratory distress. He has no wheezes. He has no rales.  Musculoskeletal: He exhibits no edema.  Lymphadenopathy:    He has no cervical adenopathy.  Neurological:       Increased tone Able to move extremities but doesn't for my exam  Skin:       Ulcer on left forehead through dermis. Mild bleeding Several scabbed areas---over spine, left knee          Assessment & Plan:

## 2012-04-27 NOTE — Assessment & Plan Note (Signed)
Hard to judge mood but worsened with wean attempt so will continue the lexapro

## 2012-04-27 NOTE — Assessment & Plan Note (Signed)
Does not appear to be an ongoing issue at this point

## 2012-04-27 NOTE — Assessment & Plan Note (Signed)
Still agitated Hard to tell about hallucinations or delusions at this point Agitation has responded to haldol and morphine at times (clearly some discomfort or pain but may not be primary problem) Discussed using combo of both meds together to work towards great comfort

## 2012-04-27 NOTE — Assessment & Plan Note (Signed)
Seems to be in end stage but still going Discussed no Rx (like antibioitics) now On hospice care still

## 2012-05-11 ENCOUNTER — Other Ambulatory Visit: Payer: Self-pay

## 2012-05-11 NOTE — Telephone Encounter (Signed)
Micheal Townsend with [facility] left v/m requesting refill morphine to [facility] with Hospice pt on rx.

## 2012-05-12 MED ORDER — MORPHINE SULFATE (CONCENTRATE) 20 MG/ML PO SOLN: 5.0000 mg | ORAL | Status: DC | PRN

## 2012-05-12 NOTE — Telephone Encounter (Signed)
Spoke with hospice nurse and advised results rx faxed to pharmacy manually    

## 2012-06-14 ENCOUNTER — Other Ambulatory Visit: Payer: Self-pay | Admitting: *Deleted

## 2012-06-14 MED ORDER — ESCITALOPRAM OXALATE 10 MG PO TABS: 10.0000 mg | ORAL_TABLET | Freq: Every day | ORAL | Status: DC

## 2012-07-20 ENCOUNTER — Ambulatory Visit: Payer: Medicare Other | Admitting: Internal Medicine

## 2012-07-20 ENCOUNTER — Encounter: Payer: Self-pay | Admitting: Internal Medicine

## 2012-07-20 VITALS — BP 138/66 | HR 80 | Resp 20

## 2012-07-20 DIAGNOSIS — I10 Essential (primary) hypertension: Secondary | ICD-10-CM

## 2012-07-20 DIAGNOSIS — M2450 Contracture, unspecified joint: Secondary | ICD-10-CM

## 2012-07-20 DIAGNOSIS — I5022 Chronic systolic (congestive) heart failure: Secondary | ICD-10-CM

## 2012-07-20 DIAGNOSIS — F0150 Vascular dementia without behavioral disturbance: Secondary | ICD-10-CM

## 2012-07-20 DIAGNOSIS — F3289 Other specified depressive episodes: Secondary | ICD-10-CM

## 2012-07-20 DIAGNOSIS — F329 Major depressive disorder, single episode, unspecified: Secondary | ICD-10-CM

## 2012-07-20 MED ORDER — LEVOTHYROXINE SODIUM 100 MCG PO TABS: 100.0000 ug | ORAL_TABLET | Freq: Every day | ORAL | Status: DC

## 2012-07-20 NOTE — Assessment & Plan Note (Signed)
No longer evident Neutral fluid status Doesn't seem to have lost much more weight---clearly no fluid overload though

## 2012-07-20 NOTE — Assessment & Plan Note (Signed)
End stage but has been hanging on Now with more contractures, bed bound, totally dependent On hospice

## 2012-07-20 NOTE — Assessment & Plan Note (Signed)
Discussed using the roxanol for any obvious pain and before bed baths, etc

## 2012-07-20 NOTE — Progress Notes (Signed)
Subjective:    Patient ID: Micheal Townsend, male    DOB: 08/29/1916, 79 y.o.   MRN: [id number]  HPI Daughter [name], caregiver [name] here [name] hospice nurse also here  Overall status has deteriorated Completely bed bound now--no longer able to get him up to chair or shower (even with hospice aides) Left leg is now severely contracted Now with contraction of arms to one side---like prayer posture Does still eat---totally has to be fed. Will refuse till they put it under his nose Still likes watermelon and ice cream Talking more clearly than in the past---can occ carry on a conversation (though it is in the past---like speaking like he is still at his job from years ago) More yelling at night for mom Hasn't needed haldol  Feels more comfortable with someone in the room with him Tried music a while back---helped, then stopped, but they will try again They did ask about pain for a while--generally said no. Now asking for pain med more --and the roxanol does help Still has some spells where he is up for a long time--then gets more agitated (and will pull up his contractions) He does seem to relax the contractures some if he gets the roxanol  Ongoing skin problems Air mattress got unplugged somehow--developed ulcer on right hip Using the endit ointment and helping some  Current Outpatient Prescriptions on File Prior to Visit  Medication Sig Dispense Refill  . betaxolol (BETOPTIC-S) 0.25 % ophthalmic suspension Place 1 drop into the right eye 2 (two) times daily.       [name] escitalopram (LEXAPRO) 10 MG tablet Take 1 tablet (10 mg total) by mouth daily.  30 tablet  3  . fluocinonide cream (LIDEX) 0.05 % Apply topically 2 (two) times daily as needed.  30 g  3  . haloperidol (HALDOL) 1 MG tablet Take 1-2 mg by mouth every 4 (four) hours as needed.      [name] ketotifen (ZADITOR) 0.025 % ophthalmic solution Place 1 drop into both eyes 2 (two) times daily.      [name] morphine (ROXANOL) 20 MG/ML  concentrated solution Take 0.25-0.5 mLs (5-10 mg total) by mouth every hour as needed for pain.  60 mL  0  . Skin Protectants, Misc. (ENDIT EX) Apply 1 application topically 3 (three) times daily as needed.      . sodium chloride (MURO 128) 2 % ophthalmic solution Place 1 drop into the right eye 3 (three) times daily.        No current facility-administered medications on file prior to visit.    Allergies  Allergen Reactions  . Acetazolamide     REACTION: diarrhea  . Cephalexin     REACTION: diarrhea  . Zolpidem Tartrate     Past Medical History  Diagnosis Date  . Allergy   . Depression   . Stroke   . Hyperlipidemia   . Hypothyroidism        . Hypertension   . CHF (congestive heart failure)     systolic  . Vascular dementia   . Constipation   . Breast cancer     Right  . Psychosis   . Seizure disorder     Past Surgical History  Procedure Laterality Date  . Appendectomy    . Eye surgery      cataract surgery    Family History  Problem Relation Age of Onset  . Alzheimer's disease Sister   . Alzheimer's disease Sister     History  Social History  . Marital Status: Widowed    Spouse Name: N/A    Number of Children: 1  . Years of Education: N/A   Occupational History  . sheet metal worker     retired   Social History Main Topics  . Smoking status: Former Games developer  . Smokeless tobacco: Not on file  . Alcohol Use: No  . Drug Use: Not on file  . Sexually Active: Not on file   Other Topics Concern  . Not on file   Social History Narrative   Lives with daughter   She has health care POA   Has DNR   No tube feeds   Review of Systems Keeps his eyes closed most of the time Still with facial skin cancers Variable with bowels---rarely will skip days (still sporadic) Regular with voids---gets concentrated/smelly if his oral intake is low No apparent seizures     Objective:   Physical Exam  Constitutional: No distress.  Neck: No thyromegaly  present.  Cardiovascular: Normal rate, regular rhythm and normal heart sounds.  Exam reveals no gallop.   No murmur heard. Pulmonary/Chest: Effort normal and breath sounds normal. No respiratory distress. He has no wheezes. He has no rales.  Abdominal: Soft. There is no tenderness.  Musculoskeletal:  Contractures in legs>arms  Lymphadenopathy:    He has no cervical adenopathy.  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

## 2012-07-20 NOTE — Assessment & Plan Note (Signed)
Hard to tell but due to the chronicity of this, will continue the escitalopram

## 2012-07-20 NOTE — Assessment & Plan Note (Signed)
BP Readings from Last 3 Encounters:  07/20/12 138/66  04/27/12 130/70  02/10/12 130/84   Okay without meds

## 2012-07-27 ENCOUNTER — Telehealth: Payer: Self-pay | Admitting: Internal Medicine

## 2012-07-27 NOTE — Telephone Encounter (Signed)
Call from hospice nurse-teresa No void in 8 hours or more Has been drinking okay Asked her to straight cath to make sure he is not retaining urine Leave in if greater than 300cc

## 2012-08-26 ENCOUNTER — Telehealth: Payer: Self-pay | Admitting: *Deleted

## 2012-08-26 NOTE — Telephone Encounter (Signed)
Hospice nurse calling stating that pt has had a foley catheter for over a month, nurse changed on Wednesday because pt was leaking around the catheter, he had a 14 Coude and now he has a 16, pt now complains that he has to "pee" when he never has before, he tolerated the procedure well but now his bag has a lot of sediment and blood. [name] is on another visit and can see pt later this afternoon if needed, the family used a forehead strip to check his temp at 100, please advise if pt may have a UTI or if medication is needed

## 2012-08-26 NOTE — Telephone Encounter (Signed)
Discussed with daughter We don't want to get into antibiotics regardless but sounds like he may be partially obstructing with the sediment  Discussed with [name] She will try flushing (I suggested half strength acetic acid). May try to replace with an 18G next time if she can get one. She will teach them how to flush also

## 2012-10-12 ENCOUNTER — Ambulatory Visit: Payer: Medicare Other | Admitting: Internal Medicine

## 2012-10-12 ENCOUNTER — Encounter: Payer: Self-pay | Admitting: Internal Medicine

## 2012-10-12 VITALS — BP 128/60 | HR 84 | Temp 97.0°F | Resp 16

## 2012-10-12 DIAGNOSIS — L8990 Pressure ulcer of unspecified site, unspecified stage: Secondary | ICD-10-CM

## 2012-10-12 DIAGNOSIS — F329 Major depressive disorder, single episode, unspecified: Secondary | ICD-10-CM

## 2012-10-12 DIAGNOSIS — F0150 Vascular dementia without behavioral disturbance: Secondary | ICD-10-CM

## 2012-10-12 DIAGNOSIS — I5022 Chronic systolic (congestive) heart failure: Secondary | ICD-10-CM

## 2012-10-12 DIAGNOSIS — L8992 Pressure ulcer of unspecified site, stage 2: Secondary | ICD-10-CM

## 2012-10-12 DIAGNOSIS — M2450 Contracture, unspecified joint: Secondary | ICD-10-CM

## 2012-10-12 DIAGNOSIS — F3289 Other specified depressive episodes: Secondary | ICD-10-CM

## 2012-10-12 NOTE — Assessment & Plan Note (Signed)
Has verbalized being ready to go "home" This is when he was uncomfortable Will cut lexapro in half in case it is causing sedation at all

## 2012-10-12 NOTE — Assessment & Plan Note (Signed)
End stage Still holding on though Continued decline

## 2012-10-12 NOTE — Assessment & Plan Note (Signed)
At least 2 Skin cancers on face Will not be able to heal with nutritional problems and being bed bound Still using end-it Discussed positioning --- has alternating pressure mattress-- prefers supine or right side

## 2012-10-12 NOTE — Progress Notes (Signed)
Subjective:    Patient ID: Micheal Townsend, male    DOB: May 20, 1917, 79 y.o.   MRN: [id number]  HPI [name] --caregiver and [name] daughter here Billie-hospice social worker and [name] --hospice nurse are here  Hospice aide three times a week Gives bed bath They give the morphine before her visits  Not doing well Multiple skin problems Facial cancers still --- also on bottom, trunk and arms Seems to be making him uncomfortable---especially with changing position  Still eats some Chicken stew (soft), eggs, ice cream Still seems to be losing weight and muscle mass  Totally bed bound Incontinent and harder to clean due to contractures  Has been getting roxanol once a day usually If on his left side, has more pain Still using 0.78ml---but has needed repeat at times Hasn't needed the haldol Seems to be more comfortable when on his back  Some itching ?related to the morphine  Sleeping a lot of the time Day and night  No prolonged awake times  Current Outpatient Prescriptions on File Prior to Visit  Medication Sig Dispense Refill  . betaxolol (BETOPTIC-S) 0.25 % ophthalmic suspension Place 1 drop into the right eye 2 (two) times daily.       [name] escitalopram (LEXAPRO) 10 MG tablet Take 1 tablet (10 mg total) by mouth daily.  30 tablet  3  . fluocinonide cream (LIDEX) 0.05 % Apply topically 2 (two) times daily as needed.  30 g  3  . haloperidol (HALDOL) 1 MG tablet Take 1-2 mg by mouth every 4 (four) hours as needed.      [name] ketotifen (ZADITOR) 0.025 % ophthalmic solution Place 1 drop into both eyes 2 (two) times daily.      [name] levothyroxine (SYNTHROID, LEVOTHROID) 100 MCG tablet Take 1 tablet (100 mcg total) by mouth daily.  90 tablet  3  . morphine (ROXANOL) 20 MG/ML concentrated solution Take 0.25-0.5 mLs (5-10 mg total) by mouth every hour as needed for pain.  60 mL  0  . Skin Protectants, Misc. (ENDIT EX) Apply 1 application topically 3 (three) times daily as needed.      . sodium  chloride (MURO 128) 2 % ophthalmic solution Place 1 drop into the right eye 3 (three) times daily.        No current facility-administered medications on file prior to visit.    Allergies  Allergen Reactions  . Acetazolamide     REACTION: diarrhea  . Cephalexin     REACTION: diarrhea  . Zolpidem Tartrate     Past Medical History  Diagnosis Date  . Allergy   . Depression   . Stroke   . Hyperlipidemia   . Hypothyroidism        . Hypertension   . CHF (congestive heart failure)     systolic  . Vascular dementia   . Constipation   . Breast cancer     Right  . Psychosis   . Seizure disorder     Past Surgical History  Procedure Laterality Date  . Appendectomy    . Eye surgery      cataract surgery    Family History  Problem Relation Age of Onset  . Alzheimer's disease Sister   . Alzheimer's disease Sister     History   Social History  . Marital Status: Widowed    Spouse Name: N/A    Number of Children: 1  . Years of Education: N/A   Occupational History  . sheet metal worker  retired   Social History Main Topics  . Smoking status: Former Games developer  . Smokeless tobacco: Not on file  . Alcohol Use: No  . Drug Use: Not on file  . Sexually Active: Not on file   Other Topics Concern  . Not on file   Social History Narrative   Lives with daughter   She has health care POA   Has DNR   No tube feeds   Review of Systems Bowels are fairly regular Seems to be voiding okay--had Foley for a while but it is out now (leaking, then swelling with higher gauge catheter)    Objective:   Physical Exam  Constitutional:  bedbound Some garbled words Doesn't engage  Neck: No thyromegaly present.  Cardiovascular: Normal rate and regular rhythm.   Distant heart sounds  Pulmonary/Chest: Effort normal. No respiratory distress. He has no wheezes. He has no rales.  Decreased breath sounds but clear  Abdominal: Soft. There is no tenderness.  Lymphadenopathy:    He  has no cervical adenopathy.  Skin:  Skin cancers on face Stage 2 ulcers on sacrum and toe Widespread stage 1 ulcers arms and trunk Petechiae on right arm (?from blood pressure trunk"          Assessment & Plan:

## 2012-10-12 NOTE — Assessment & Plan Note (Signed)
Not apparent at this point

## 2012-10-12 NOTE — Assessment & Plan Note (Signed)
Discussed titrating the morphine Some itching--doubt from the morphine but can try benedryl prn for this

## 2012-10-19 ENCOUNTER — Other Ambulatory Visit: Payer: Self-pay | Admitting: Internal Medicine

## 2012-10-19 MED ORDER — ESCITALOPRAM OXALATE 10 MG PO TABS: 5.0000 mg | ORAL_TABLET | Freq: Every day | ORAL | Status: DC

## 2012-10-19 NOTE — Telephone Encounter (Signed)
rx sent to pharmacy by e-script  

## 2012-10-19 NOTE — Telephone Encounter (Signed)
Okay for 1 year 

## 2012-10-19 NOTE — Telephone Encounter (Signed)
Received fax refill request, please advise 

## 2012-10-21 MED ORDER — ESCITALOPRAM OXALATE 10 MG PO TABS: 5.0000 mg | ORAL_TABLET | Freq: Every day | ORAL | Status: DC

## 2012-10-21 NOTE — Addendum Note (Signed)
Addended by: Shon Millet on: 10/21/2012 04:52 PM   Modules accepted: Orders

## 2012-10-21 NOTE — Telephone Encounter (Signed)
[facility] advise me that they didn't receive Rx due to their system being down, Rx resent to pharmacy

## 2012-11-16 ENCOUNTER — Telehealth: Payer: Self-pay

## 2012-11-16 NOTE — Telephone Encounter (Signed)
[name] with [facility] left v/m pt's Lexapro 10 mg had been decreased to 1/2 tab daily. Pt did not do well; got agitated and pt was given 10 mg at bedtime. [name] request new rx Lexapro 10 mg with instructions one daily at bedtime to [facility].Please advise.

## 2012-11-17 MED ORDER — ESCITALOPRAM OXALATE 10 MG PO TABS: 5.0000 mg | ORAL_TABLET | Freq: Every day | ORAL | Status: DC

## 2012-11-17 NOTE — Telephone Encounter (Signed)
Okay to fill for a year 

## 2012-11-17 NOTE — Telephone Encounter (Signed)
rx sent to pharmacy by e-script  

## 2012-11-18 NOTE — Telephone Encounter (Signed)
[name], RN with [facility] left message stating that she had called to request Lexapro for pt and that [facility] had not received refill authorization.  I spoke with Rob at [facility] who stated they did receive it yesterday.  Larrie Kass, RN and informed her that medication was ready to be picked up at [facility].

## 2012-11-23 NOTE — Telephone Encounter (Signed)
Teresa with [facility] said Lexapro 10 mg instructions did not get changed to taking one tab daily; pt taking 1/2 tab daily; pt gets agitated. [name] request new rx sent to [facility].Please advise.[name] said call back on 11/24/12 would be OK.

## 2012-11-24 MED ORDER — ESCITALOPRAM OXALATE 10 MG PO TABS: 10.0000 mg | ORAL_TABLET | Freq: Every day | ORAL | Status: AC

## 2012-11-24 NOTE — Telephone Encounter (Signed)
Spoke with hospice nurse and advised results rx sent to pharmacy by e-script  

## 2012-11-24 NOTE — Telephone Encounter (Signed)
Okay to increase the dose as she states Notify the pharmacy

## 2012-11-24 NOTE — Addendum Note (Signed)
Addended by: Sueanne Margarita on: 11/24/2012 10:36 AM   Modules accepted: Orders

## 2013-01-11 ENCOUNTER — Other Ambulatory Visit: Payer: Self-pay

## 2013-01-11 NOTE — Telephone Encounter (Signed)
[name] nurse with [facility] left v/m requesting morphine 20 mg/ml concentrated solution faxed to [facility] hospice pt. Instructions requested are different from med list; request 0.5 ml - 1 ml q1h prn pain. Last filled # 30 ml on 10/20/12.Please advise. Pt is not out of med and refill on 01/12/13 is OK.

## 2013-01-12 MED ORDER — MORPHINE SULFATE (CONCENTRATE) 20 MG/ML PO SOLN
10.0000 mg | ORAL | Status: DC | PRN
Start: 2013-01-11 — End: 2013-03-08

## 2013-01-12 NOTE — Telephone Encounter (Signed)
rx faxed to pharmacy manually  

## 2013-01-18 ENCOUNTER — Encounter: Payer: Self-pay | Admitting: Internal Medicine

## 2013-01-18 ENCOUNTER — Ambulatory Visit: Payer: Medicare Other | Admitting: Internal Medicine

## 2013-01-18 VITALS — BP 124/62 | HR 72 | Resp 20

## 2013-01-18 DIAGNOSIS — I5022 Chronic systolic (congestive) heart failure: Secondary | ICD-10-CM

## 2013-01-18 DIAGNOSIS — E039 Hypothyroidism, unspecified: Secondary | ICD-10-CM

## 2013-01-18 DIAGNOSIS — M2450 Contracture, unspecified joint: Secondary | ICD-10-CM

## 2013-01-18 DIAGNOSIS — F29 Unspecified psychosis not due to a substance or known physiological condition: Secondary | ICD-10-CM

## 2013-01-18 DIAGNOSIS — F0150 Vascular dementia without behavioral disturbance: Secondary | ICD-10-CM

## 2013-01-18 DIAGNOSIS — Z23 Encounter for immunization: Secondary | ICD-10-CM

## 2013-01-18 DIAGNOSIS — F329 Major depressive disorder, single episode, unspecified: Secondary | ICD-10-CM

## 2013-01-18 NOTE — Assessment & Plan Note (Signed)
The lexapro seems to help his mood Will continue

## 2013-01-18 NOTE — Assessment & Plan Note (Signed)
Remains on his replacement

## 2013-01-18 NOTE — Assessment & Plan Note (Signed)
Severe and has seemed to be end stage for quite some time---but holding on Daughter doing okay with caregiver and hospice

## 2013-01-18 NOTE — Progress Notes (Signed)
Subjective:    Patient ID: Micheal Townsend, male    DOB: 12/28/1916, 79 y.o.   MRN: [id number]  HPI Micheal Townsend daughter and Micheal Townsend caregiver are here Micheal Townsend --hospice nurse also  Continues to hold on but not sure how Eats some but seems to have lost weight Some good days--like yesterday-- had eyes open much of the day Still responds Will eat when offered food--total feed  Incontinent of bowel and bladder Using condom catheter at night  Skin issues are stable Stage 2 over his spine Open area behind right ear---started with oxygen tubing. Oozes and some odor Some skin irritation under hand and knee contractures  Still gets bathed by hospice 3 days per week Gets morphine before bath and several times per week prn for pain  Current Outpatient Prescriptions on File Prior to Visit  Medication Sig Dispense Refill  . betaxolol (BETOPTIC-S) 0.25 % ophthalmic suspension Place 1 drop into the right eye 2 (two) times daily.       [name] escitalopram (LEXAPRO) 10 MG tablet Take 1 tablet (10 mg total) by mouth daily.  30 tablet  11  . fluocinonide cream (LIDEX) 0.05 % Apply topically 2 (two) times daily as needed.  30 g  3  . haloperidol (HALDOL) 1 MG tablet Take 1-2 mg by mouth every 4 (four) hours as needed.      [name] ketotifen (ZADITOR) 0.025 % ophthalmic solution Place 1 drop into both eyes 2 (two) times daily.      [name] levothyroxine (SYNTHROID, LEVOTHROID) 100 MCG tablet Take 1 tablet (100 mcg total) by mouth daily.  90 tablet  3  . morphine (ROXANOL) 20 MG/ML concentrated solution Take 0.5-1 mLs (10-20 mg total) by mouth every hour as needed for pain.  60 mL  0  . Skin Protectants, Misc. (ENDIT EX) Apply 1 application topically 3 (three) times daily as needed.      . sodium chloride (MURO 128) 2 % ophthalmic solution Place 1 drop into the right eye 3 (three) times daily.        No current facility-administered medications on file prior to visit.    Allergies  Allergen Reactions  . Acetazolamide      REACTION: diarrhea  . Cephalexin     REACTION: diarrhea  . Zolpidem Tartrate     Past Medical History  Diagnosis Date  . Allergy   . Depression   . Stroke   . Hyperlipidemia   . Hypothyroidism        . Hypertension   . CHF (congestive heart failure)     systolic  . Vascular dementia   . Constipation   . Breast cancer     Right  . Psychosis   . Seizure disorder     Past Surgical History  Procedure Laterality Date  . Appendectomy    . Eye surgery      cataract surgery    Family History  Problem Relation Age of Onset  . Alzheimer's disease Sister   . Alzheimer's disease Sister     History   Social History  . Marital Status: Widowed    Spouse Name: N/A    Number of Children: 1  . Years of Education: N/A   Occupational History  . sheet metal worker     retired   Social History Main Topics  . Smoking status: Former Games developer  . Smokeless tobacco: Not on file  . Alcohol Use: No  . Drug Use: Not on file  . Sexual Activity:  Not on file   Other Topics Concern  . Not on file   Social History Narrative   Lives with daughter   She has health care POA   Has DNR   No tube feeds   Review of Systems Sill seems to itch on right side. Benedryl seems to help the itching and he relaxes (12.5) Hasn't needed the haldol Bowels are variable---- went many days last week without, but better with prunes No seizures    Objective:   Physical Exam  Constitutional: No distress.  Somnolent Responded to flu shot but no words Then did speak briefly to his caregiver  Neck: No thyromegaly present.  Cardiovascular: Normal rate, regular rhythm and normal heart sounds.  Exam reveals no gallop.   No murmur heard. Pulmonary/Chest: Effort normal and breath sounds normal. No respiratory distress. He has no wheezes. He has no rales.  Abdominal: Soft. There is no tenderness.  Musculoskeletal: He exhibits no edema.  Severe contracture left knee (touching along the entire  leg/calf) Moderately severe right knee Hands and elbows with moderate contracture   Lymphadenopathy:    He has no cervical adenopathy.  Neurological:  Increased tone throughout  Skin:  Multiple sores Lesion behind right ear looks like early neoplasm          Assessment & Plan:

## 2013-01-18 NOTE — Assessment & Plan Note (Signed)
No fluid overload No apparent dyspnea No Rx indicated

## 2013-01-18 NOTE — Assessment & Plan Note (Signed)
Trying to keep dry inside contractures and avoid breakdown

## 2013-01-18 NOTE — Assessment & Plan Note (Signed)
Not evident lately Hasn't needed the haldol

## 2013-02-17 ENCOUNTER — Telehealth: Payer: Self-pay

## 2013-02-17 NOTE — Telephone Encounter (Signed)
[facility] is at pts home and cannot get a voided urine specimen. Dr Alphonsus Sias said OK to catheterize pt if needed to get urine specimen. Jan said will try again to get specimen if unable will do in and out cath.

## 2013-02-17 NOTE — Telephone Encounter (Signed)
Okay to send urine

## 2013-02-17 NOTE — Telephone Encounter (Signed)
I called and left message Okay to check urine If urinalysis is markedly abnormal, would start empiric antibiotics pending the culture

## 2013-02-17 NOTE — Telephone Encounter (Signed)
Micheal Townsend with [facility] request  Verbal order for urinalysis; pts daughter called Micheal Townsend requesting U/A due to odor of urine and urine appears thick.Please advise.

## 2013-02-20 ENCOUNTER — Telehealth: Payer: Self-pay | Admitting: Family Medicine

## 2013-02-20 NOTE — Telephone Encounter (Signed)
Please check on his status with [name] his RN from hospice

## 2013-02-20 NOTE — Telephone Encounter (Signed)
Confidential Office Message [address] [place], [place] [address] p. [phone] f. [phone] To: [facility] (After Hours Triage) Fax: [phone] From: [facility] Date/ Time: 02/18/2013 5:45 PM Taken By: Merlinda Frederick, CSR Caller: Thayer Ohm Facility: [facility] Patient: Micheal Townsend, Micheal Townsend DOB: [birth year] Phone: [phone] Reason for Call: Calling to send preliminary results, Will not have culture back until Sunday or Monday and will call again with results at that time. Does not need call back, UA showed: moderate blood, Pos nitrate, moderate leukocyte. WBC: 21-50. RBC: 3-6, BAC/HPF: Many. Regarding Appointment: Appt Date: Appt Time: Unknown Provider: Reason: Details: Confidential Outcome:

## 2013-02-21 NOTE — Telephone Encounter (Signed)
Spoke with nurse and pt has liquid cipro and is doing better

## 2013-02-28 ENCOUNTER — Telehealth: Payer: Self-pay | Admitting: *Deleted

## 2013-02-28 NOTE — Telephone Encounter (Signed)
Routed to PCP as FYI.

## 2013-02-28 NOTE — Telephone Encounter (Signed)
Thanks

## 2013-02-28 NOTE — Telephone Encounter (Signed)
Hospice calling asking for order to do a cath because the family calling stating that pt has not urinated all day. I verbally asked Dr.Duncan and he stated do a in and out cath and if she can't get any urine or can't get the cath in to let us know. I advised those instructions to the nurse and she agreed.

## 2013-03-01 NOTE — Telephone Encounter (Signed)
Noted He may be approaching the time when a Foley would be a good idea

## 2013-03-03 ENCOUNTER — Encounter: Payer: Self-pay | Admitting: Internal Medicine

## 2013-03-08 ENCOUNTER — Encounter: Payer: Self-pay | Admitting: Internal Medicine

## 2013-03-08 ENCOUNTER — Other Ambulatory Visit: Payer: Self-pay

## 2013-03-08 MED ORDER — MORPHINE SULFATE (CONCENTRATE) 20 MG/ML PO SOLN: 10.0000 mg | ORAL | Status: AC | PRN

## 2013-03-08 NOTE — Telephone Encounter (Signed)
rx faxed to pharmacy manually  

## 2013-03-08 NOTE — Telephone Encounter (Signed)
Micheal Townsend with [facility] request refill morphine to [facility] pharmacy.Please advise.

## 2013-03-08 NOTE — Telephone Encounter (Signed)
Please fax

## 2013-04-05 ENCOUNTER — Encounter: Payer: Self-pay | Admitting: Internal Medicine

## 2013-04-05 ENCOUNTER — Ambulatory Visit: Payer: Medicare Other | Admitting: Internal Medicine

## 2013-04-05 VITALS — BP 118/60 | HR 72 | Resp 20

## 2013-04-05 DIAGNOSIS — L8992 Pressure ulcer of unspecified site, stage 2: Secondary | ICD-10-CM

## 2013-04-05 DIAGNOSIS — I5022 Chronic systolic (congestive) heart failure: Secondary | ICD-10-CM

## 2013-04-05 DIAGNOSIS — L8990 Pressure ulcer of unspecified site, unspecified stage: Secondary | ICD-10-CM

## 2013-04-05 DIAGNOSIS — R443 Hallucinations, unspecified: Secondary | ICD-10-CM

## 2013-04-05 DIAGNOSIS — M2450 Contracture, unspecified joint: Secondary | ICD-10-CM

## 2013-04-05 DIAGNOSIS — F0150 Vascular dementia without behavioral disturbance: Secondary | ICD-10-CM

## 2013-04-05 DIAGNOSIS — I6990 Unspecified sequelae of unspecified cerebrovascular disease: Secondary | ICD-10-CM

## 2013-04-05 NOTE — Assessment & Plan Note (Signed)
Main issue is cognitive Now with contractures in legs that are due to the dementia, not the stroke

## 2013-04-05 NOTE — Assessment & Plan Note (Signed)
Not evident now No Rx

## 2013-04-05 NOTE — Assessment & Plan Note (Signed)
Severe Has the morphine for pain prn---like before baths

## 2013-04-05 NOTE — Assessment & Plan Note (Signed)
End stage but still here On hospice Total care

## 2013-04-05 NOTE — Assessment & Plan Note (Signed)
Amazing that there is not more than this Excellent care has kept him from getting more than stage 1 in other areas

## 2013-04-05 NOTE — Assessment & Plan Note (Signed)
Persist but not bothersome Hasn't needed the haldol

## 2013-04-05 NOTE — Progress Notes (Signed)
Subjective:    Patient ID: Micheal Townsend, male    DOB: [birth year], 79 y.o.   MRN: [id number]  HPI [name]--daughter and caregiver here [name] --hospice nurse here and Dr Delanna Notice from hospice  Still hanging on Continues to lose weight though he eats  Bed sores come and go--still present over thoracic vertebrae Will have hip areas and then they heal up for a while  Had foley in but got spasms Now back out for 2 weeks Also some trouble leaking around it  Sleeps most of the time Some agitation at times---talking to someone who isn't there Will hollar out at night at times Hasn't needed the haldol  Does get the morphine before 3 time per week baths with hospice aides  Some constipation Now getting the sorbitol every day---this is helping 20cc and then additional 10cc if needed  Current Outpatient Prescriptions on File Prior to Visit  Medication Sig Dispense Refill  . betaxolol (BETOPTIC-S) 0.25 % ophthalmic suspension Place 1 drop into the right eye 2 (two) times daily.       [name] escitalopram (LEXAPRO) 10 MG tablet Take 1 tablet (10 mg total) by mouth daily.  30 tablet  11  . haloperidol (HALDOL) 1 MG tablet Take 1-2 mg by mouth every 4 (four) hours as needed.      [name] ketotifen (ZADITOR) 0.025 % ophthalmic solution Place 1 drop into both eyes 2 (two) times daily as needed.       [name] levothyroxine (SYNTHROID, LEVOTHROID) 100 MCG tablet Take 1 tablet (100 mcg total) by mouth daily.  90 tablet  3  . morphine (ROXANOL) 20 MG/ML concentrated solution Take 0.5-1 mLs (10-20 mg total) by mouth every hour as needed for pain.  60 mL  0  . Skin Protectants, Misc. (ENDIT EX) Apply 1 application topically 3 (three) times daily as needed.      . sodium chloride (MURO 128) 2 % ophthalmic solution Place 1 drop into the right eye 3 (three) times daily.        No current facility-administered medications on file prior to visit.    Allergies  Allergen Reactions  . Acetazolamide     REACTION:  diarrhea  . Cephalexin     REACTION: diarrhea  . Zolpidem Tartrate     Past Medical History  Diagnosis Date  . Allergy   . Depression   . Stroke   . Hyperlipidemia   . Hypothyroidism        . Hypertension   . CHF (congestive heart failure)     systolic  . Vascular dementia   . Constipation   . Breast cancer     Right  . Psychosis   . Seizure disorder     Past Surgical History  Procedure Laterality Date  . Appendectomy    . Eye surgery      cataract surgery    Family History  Problem Relation Age of Onset  . Alzheimer's disease Sister   . Alzheimer's disease Sister     History   Social History  . Marital Status: Widowed    Spouse Name: N/A    Number of Children: 1  . Years of Education: N/A   Occupational History  . sheet metal worker     retired   Social History Main Topics  . Smoking status: Former Games developer  . Smokeless tobacco: Not on file  . Alcohol Use: No  . Drug Use: Not on file  . Sexual Activity: Not on file  Other Topics Concern  . Not on file   Social History Narrative   Lives with daughter   She has health care POA   Has DNR   No tube feeds   Review of Systems Has occasional cough after swallowing---not consistent No obvious aspiration    Objective:   Physical Exam  Constitutional: No distress.  Asleep but answers to voice---states "you look good" though eyes are closed Obvious weight loss--- no subcutaneous tissue at all  Neck: No thyromegaly present.  Cardiovascular: Normal rate, regular rhythm and normal heart sounds.  Exam reveals no gallop.   No murmur heard. Pulmonary/Chest: Effort normal and breath sounds normal. No respiratory distress. He has no wheezes. He has no rales.  Abdominal: Soft. There is no tenderness.  Musculoskeletal: He exhibits no edema.  Lymphadenopathy:    He has no cervical adenopathy.  Neurological:  Contractures with foot into buttocks No sig active movement---slight with arms Milder  contractures in arms  Skin:  Stage 2 on upper back Several red areas in spots on legs  Psychiatric:  Seems neutral with mood--mostly just sleeps          Assessment & Plan:

## 2013-05-23 ENCOUNTER — Telehealth: Payer: Self-pay

## 2013-05-23 NOTE — Telephone Encounter (Signed)
Teresa with [facility] left v/m requesting order for Senokot S taking 1 - 4 tabs twice daily as needed for constipation. [place]. [name] request cb.

## 2013-05-24 MED ORDER — SENNA-DOCUSATE SODIUM 8.6-50 MG PO TABS: 1.0000 | ORAL_TABLET | Freq: Every day | ORAL | Status: AC

## 2013-05-24 NOTE — Telephone Encounter (Signed)
Okay to send order for a year--- #120 x 11

## 2013-05-24 NOTE — Telephone Encounter (Signed)
Spoke with hospice nurse [name] and advised results rx sent to pharmacy by e-script

## 2013-07-05 ENCOUNTER — Encounter: Payer: Self-pay | Admitting: Internal Medicine

## 2013-07-05 ENCOUNTER — Ambulatory Visit: Payer: Medicare Other | Admitting: Internal Medicine

## 2013-07-05 ENCOUNTER — Telehealth: Payer: Self-pay

## 2013-07-05 VITALS — BP 114/58 | HR 72 | Resp 18

## 2013-07-05 DIAGNOSIS — L8990 Pressure ulcer of unspecified site, unspecified stage: Secondary | ICD-10-CM

## 2013-07-05 DIAGNOSIS — L8992 Pressure ulcer of unspecified site, stage 2: Secondary | ICD-10-CM

## 2013-07-05 DIAGNOSIS — M2450 Contracture, unspecified joint: Secondary | ICD-10-CM

## 2013-07-05 DIAGNOSIS — F0150 Vascular dementia without behavioral disturbance: Secondary | ICD-10-CM

## 2013-07-05 DIAGNOSIS — R443 Hallucinations, unspecified: Secondary | ICD-10-CM

## 2013-07-05 DIAGNOSIS — I5022 Chronic systolic (congestive) heart failure: Secondary | ICD-10-CM

## 2013-07-05 DIAGNOSIS — I6990 Unspecified sequelae of unspecified cerebrovascular disease: Secondary | ICD-10-CM

## 2013-07-05 NOTE — Telephone Encounter (Signed)
Micheal Townsend pts daughter left v/m; Micheal Townsend wanted Dr Silvio Pate to know that Helene Kelp the hospice nurse will not be at pts home when Dr Silvio Pate comes today for home visit at 2:30pm.

## 2013-07-05 NOTE — Assessment & Plan Note (Signed)
And multiple skin cancers They are doing a good job of preventing worsening in any site

## 2013-07-05 NOTE — Assessment & Plan Note (Signed)
Still at end stage Hard to believe he is still alive On hospice and comfort care only Will not treat infections, etc

## 2013-07-05 NOTE — Assessment & Plan Note (Signed)
Persists---especially at night Will speak to people who aren't there Occasional agitation but doesn't seem to be from the psychotic issues (discomfort during bath more) Hasn't needed the haloperidol

## 2013-07-05 NOTE — Telephone Encounter (Signed)
Okay I will still come

## 2013-07-05 NOTE — Assessment & Plan Note (Signed)
Certainly the reason for the cognitive issues

## 2013-07-05 NOTE — Assessment & Plan Note (Signed)
Now right leg also No clear explanation for this but I suspect it will now stay contracted like the left one

## 2013-07-05 NOTE — Progress Notes (Signed)
Subjective:    Patient ID: Micheal Townsend, male    DOB: 1916-08-26, 80 y.o.   MRN: [id number]  HPI [name]--aide and daughter are here  Having a bad day Seems to be having pain in right leg (the usually not contracted leg) Now has it pulled up Some swelling in foot--goes back 2 days Has needed extra pain meds No apparent injury--they only roll him side to side in the bed  Still has hospice aides 3 times per week to bathe him Needs the morphine before those baths Using 1 other dose most other days at some point (usually 0.5cc)  Still eats Seems to have lost more weight though---wasting in face More "bony" and more continuous red spots all over 1 open area on upper spine  Still hallucinates at times Agitated at times---will strike out (fortunately rarely successful at hitting) Hasn't gotten the haloperidol Talks to people at night--- slow to initiate sleep Will awake at 4AM "I'm dry as a bone" May have some days where he seems to be sleeping all day---but not as much as it was in the past  Current Outpatient Prescriptions on File Prior to Visit  Medication Sig Dispense Refill  . betaxolol (BETOPTIC-S) 0.25 % ophthalmic suspension Place 1 drop into the right eye 2 (two) times daily.       [name] escitalopram (LEXAPRO) 10 MG tablet Take 1 tablet (10 mg total) by mouth daily.  30 tablet  11  . haloperidol (HALDOL) 1 MG tablet Take 1-2 mg by mouth every 4 (four) hours as needed.      [name] ketotifen (ZADITOR) 0.025 % ophthalmic solution Place 1 drop into both eyes 2 (two) times daily as needed.       [name] levothyroxine (SYNTHROID, LEVOTHROID) 100 MCG tablet Take 1 tablet (100 mcg total) by mouth daily.  90 tablet  3  . morphine (ROXANOL) 20 MG/ML concentrated solution Take 0.5-1 mLs (10-20 mg total) by mouth every hour as needed for pain.  60 mL  0  . sennosides-docusate sodium (SENOKOT-S) 8.6-50 MG tablet Take 1-4 tablets by mouth daily.  120 tablet  11  . sodium chloride (MURO 128) 2 %  ophthalmic solution Place 1 drop into the right eye 3 (three) times daily.        No current facility-administered medications on file prior to visit.    Allergies  Allergen Reactions  . Acetazolamide     REACTION: diarrhea  . Cephalexin     REACTION: diarrhea  . Zolpidem Tartrate     Past Medical History  Diagnosis Date  . Allergy   . Depression   . Stroke   . Hyperlipidemia   . Hypothyroidism        . Hypertension   . CHF (congestive heart failure)     systolic  . Vascular dementia   . Constipation   . Breast cancer     Right  . Psychosis   . Seizure disorder     Past Surgical History  Procedure Laterality Date  . Appendectomy    . Eye surgery      cataract surgery    Family History  Problem Relation Age of Onset  . Alzheimer's disease Sister   . Alzheimer's disease Sister     History   Social History  . Marital Status: Widowed    Spouse Name: N/A    Number of Children: 1  . Years of Education: N/A   Occupational History  . sheet metal worker  retired   Social History Main Topics  . Smoking status: Former Research scientist (life sciences)  . Smokeless tobacco: Not on file  . Alcohol Use: No  . Drug Use: Not on file  . Sexual Activity: Not on file   Other Topics Concern  . Not on file   Social History Narrative   Lives with daughter   She has health care POA   Has DNR   No tube feeds   Review of Systems Still with several apparent skin cancers on face Bowels slow---getting senna with some help They use a condom catheter at night (not moving around as much)     Objective:   Physical Exam  Constitutional: No distress.  Neck: No thyromegaly present.  Mass at angle of right mandible--- too low for parotid (node, mass?)  Cardiovascular: Normal rate, regular rhythm and normal heart sounds.  Exam reveals no gallop.   No murmur heard. Pulmonary/Chest: Effort normal and breath sounds normal. No respiratory distress. He has no wheezes. He has no rales.    Abdominal: There is no tenderness.  scaphoid  Musculoskeletal:  Right leg now fully contracted at knee---pain with passive movement at knee. Ankle puffy but not tender  Skin:  Multiple facial lesions still Superficial ulcer over ~T2-3 Lots of red areas (they use endit)  Psychiatric:  Some discomfort Does interact a little with sparse speech          Assessment & Plan:

## 2013-07-05 NOTE — Assessment & Plan Note (Signed)
Not evident Lungs are clear No Rx

## 2013-07-14 ENCOUNTER — Telehealth: Payer: Self-pay

## 2013-07-14 MED ORDER — ATROPINE SULFATE 1 % OP SOLN: 4.0000 [drp] | OPHTHALMIC | Status: AC | PRN

## 2013-07-14 MED ORDER — LEVOTHYROXINE SODIUM 100 MCG PO TABS: 100.0000 ug | ORAL_TABLET | Freq: Every day | ORAL | Status: AC

## 2013-07-14 NOTE — Telephone Encounter (Signed)
Micheal Townsend with [facility] left v/m; pt needs refill of levothyroxine and needs order for atropine opthalmic drops 1% with directions 4 - 6 drops sublingual q 4-6 hours as needed for increased oral secretions. Pt has had significant decline over past week. Micheal Townsend said pt has multiple new skin breakdowns on hips and feet; appears like Micheal Townsend terminal ulcers;pt is still eating but Micheal Townsend feels pt may have 2-4 weeks. Micheal Townsend request cb.

## 2013-07-14 NOTE — Telephone Encounter (Signed)
Okay to refill the levothyroxine for a year  I am not a fan of the atropine Okay to fill 1 bottle (?30 or 60cc) but limit use unless secretions are really bad

## 2013-07-14 NOTE — Telephone Encounter (Signed)
rx sent to pharmacy by e-script Spoke with hospice nurse and advised results

## 2013-07-17 ENCOUNTER — Telehealth: Payer: Self-pay | Admitting: *Deleted

## 2013-07-17 NOTE — Telephone Encounter (Signed)
hosice nurse calling asking if anything could be done for patient's watery stool, pt is dying and the family wants comfort measures taking, per verbal from Dr. Silvio Pate ok to give 2mg  imodium twice daily.

## 2013-07-17 NOTE — Telephone Encounter (Signed)
correct 

## 2013-07-23 DEATH — deceased
